# Patient Record
Sex: Female | Born: 1955 | Hispanic: No | Smoking: Never smoker
Health system: Southern US, Community
[De-identification: ages and names within clinical notes are randomized; demographics above are authoritative.]

## PROBLEM LIST (undated history)

## (undated) DIAGNOSIS — G5632 Lesion of radial nerve, left upper limb: Secondary | ICD-10-CM

## (undated) DIAGNOSIS — I10 Essential (primary) hypertension: Secondary | ICD-10-CM

## (undated) HISTORY — PX: TUBAL LIGATION: SHX77

## (undated) HISTORY — PX: ABDOMINAL HYSTERECTOMY: SHX81

## (undated) HISTORY — PX: BREAST BIOPSY: SHX20

---

## 2017-10-02 ENCOUNTER — Other Ambulatory Visit: Payer: Self-pay | Admitting: Orthopedic Surgery

## 2017-10-04 ENCOUNTER — Encounter (HOSPITAL_BASED_OUTPATIENT_CLINIC_OR_DEPARTMENT_OTHER): Payer: Self-pay | Admitting: *Deleted

## 2017-10-04 ENCOUNTER — Other Ambulatory Visit: Payer: Self-pay

## 2017-10-06 ENCOUNTER — Encounter (HOSPITAL_BASED_OUTPATIENT_CLINIC_OR_DEPARTMENT_OTHER)
Admission: RE | Admit: 2017-10-06 | Discharge: 2017-10-06 | Disposition: A | Discharge status: Home / Self Care | Payer: 59 | Source: Ambulatory Visit | Attending: Orthopedic Surgery | Admitting: Orthopedic Surgery

## 2017-10-06 DIAGNOSIS — I10 Essential (primary) hypertension: Secondary | ICD-10-CM | POA: Diagnosis not present

## 2017-10-06 DIAGNOSIS — Z79899 Other long term (current) drug therapy: Secondary | ICD-10-CM | POA: Diagnosis not present

## 2017-10-06 DIAGNOSIS — G5632 Lesion of radial nerve, left upper limb: Secondary | ICD-10-CM | POA: Diagnosis not present

## 2017-10-06 LAB — BASIC METABOLIC PANEL
ANION GAP: 10 (ref 5–15)
BUN: 14 mg/dL (ref 6–20)
CALCIUM: 9.5 mg/dL (ref 8.9–10.3)
CHLORIDE: 104 mmol/L (ref 101–111)
CO2: 25 mmol/L (ref 22–32)
Creatinine, Ser: 0.88 mg/dL (ref 0.44–1.00)
GFR calc non Af Amer: >60 mL/min (ref >60)
Glucose, Bld: 64 mg/dL — ABNORMAL LOW (ref 65–99)
POTASSIUM: 4.2 mmol/L (ref 3.5–5.1)
Sodium: 139 mmol/L (ref 135–145)

## 2017-10-08 NOTE — H&P (Signed)
Sheila Walters is an 62 y.o. female.   CC / Reason for Visit: Left arm problem HPI: This patient presents for reevaluation, having undergone interval MRI scan on 09-28-17, revealing abnormal edema in the PIN innervated muscles.  There is no obvious extrinsic compression of the PIN.  She remains unchanged.  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  She reports that her problem began originally on the 17th, when she began to have a little bit of mild discomfort in her left forearm at work.  By the evening, she had taken more Aleve due to increased soreness.  She actually began to experience excruciating pain over the first few days, and subsequently developed left upper extremity neurological symptoms.  She was evaluated by Dr. Althea CharonMcKinley on 08-02-17, at which time she was noted to have essentially no active wrist extension or digital extension.  Ultrasound-guided steroid injection was performed on 08-24-17.  She now has a dynamic extension splint for the digits, is working with hand therapy, and underwent NCS/EMG onto-21-19 with Dr. Murray HodgkinsBartko.  The studies reveal normal EMG of the triceps, BR, and ECRL, with no motor units seen in the EDC, ECU, and DIP.  In addition radial sensory response was normal and symmetric.  She has been out of work as she apparently cannot engage in light duty work.  She reports that the pain has resolved, leaving her mainly with weakness, no specific numbness.  Past Medical History:  Diagnosis Date  . Hypertension   . Radial neuropathy, left     Past Surgical History:  Procedure Laterality Date  . ABDOMINAL HYSTERECTOMY    . BREAST BIOPSY Right   . TUBAL LIGATION      History reviewed. No pertinent family history. Social History:  reports that she has never smoked. She has never used smokeless tobacco. She reports that she does not drink alcohol or use drugs.  Allergies: No Known  Allergies  No medications prior to admission.    No results found for this or any previous visit (from the past 48 hour(s)). No results found.  Review of Systems  All other systems reviewed and are negative.   Height 5\' 6"  (1.676 m), weight 71.2 kg (157 lb). Physical Exam  Constitutional:  WD, WN, NAD HEENT:  NCAT, EOMI Neuro/Psych:  Alert & oriented to person, place, and time; appropriate mood & affect Lymphatic: No generalized UE edema or lymphadenopathy Extremities / MSK:  Both UE are normal with respect to appearance, ranges of motion, joint stability, muscle strength/tone, sensation, & perfusion except as otherwise noted:  Intact light touch sensibility throughout the left upper extremity.  There is minimal tenderness in the radial tunnel, not so much either along the radial nerve in the spiral groove or distally.  She has good strength throughout with the exception of the distally radial innervated muscles, where wrist extension is radially deviated, there is no active extension of the digits at the MP joint, nor the thumb.  Labs / Xrays:  No radiographic studies obtained today.  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  Concern exists for [place] mononeuropathy versus possible compressive neuropathy  Plan:  I discussed these findings with her.  I discussed that the clinical question at hand is whether or not she would benefit from surgical exploration and decompression.  The information that we have, is clearly incomplete and uncertain, but provides for 2 competing courses of action.  She  will discuss her situation with her husband and call back to [facility] to convey whether she would like to proceed with surgical decompression of the PIN nerve or continue observation.  I did discuss with her the details of the neuroplasty sufficient for her to provide informed consent.  If she instead wishes not to proceed surgically, we will plan reevaluation  in a month. Jodi Marble, MD 10/08/2017, 9:40 PM

## 2017-10-09 ENCOUNTER — Ambulatory Visit (HOSPITAL_BASED_OUTPATIENT_CLINIC_OR_DEPARTMENT_OTHER): Payer: 59 | Admitting: Certified Registered"

## 2017-10-09 ENCOUNTER — Ambulatory Visit (HOSPITAL_BASED_OUTPATIENT_CLINIC_OR_DEPARTMENT_OTHER)
Admission: RE | Admit: 2017-10-09 | Discharge: 2017-10-09 | Disposition: A | Discharge status: Home / Self Care | Payer: 59 | Source: Ambulatory Visit | Attending: Orthopedic Surgery | Admitting: Orthopedic Surgery

## 2017-10-09 ENCOUNTER — Encounter (HOSPITAL_BASED_OUTPATIENT_CLINIC_OR_DEPARTMENT_OTHER)
Admission: RE | Disposition: A | Discharge status: Home / Self Care | Payer: Self-pay | Source: Ambulatory Visit | Attending: Orthopedic Surgery

## 2017-10-09 ENCOUNTER — Encounter (HOSPITAL_BASED_OUTPATIENT_CLINIC_OR_DEPARTMENT_OTHER): Payer: Self-pay | Admitting: Certified Registered"

## 2017-10-09 ENCOUNTER — Other Ambulatory Visit: Payer: Self-pay

## 2017-10-09 DIAGNOSIS — Z79899 Other long term (current) drug therapy: Secondary | ICD-10-CM | POA: Insufficient documentation

## 2017-10-09 DIAGNOSIS — G5632 Lesion of radial nerve, left upper limb: Secondary | ICD-10-CM | POA: Insufficient documentation

## 2017-10-09 DIAGNOSIS — I10 Essential (primary) hypertension: Secondary | ICD-10-CM | POA: Insufficient documentation

## 2017-10-09 HISTORY — PX: ULNAR NERVE TRANSPOSITION: SHX2595

## 2017-10-09 HISTORY — DX: Lesion of radial nerve, left upper limb: G56.32

## 2017-10-09 HISTORY — DX: Essential (primary) hypertension: I10

## 2017-10-09 SURGERY — ULNAR NERVE DECOMPRESSION/TRANSPOSITION
Anesthesia: General | Site: Elbow | Laterality: Left

## 2017-10-09 MED ORDER — LIDOCAINE HCL (PF) 1 % IJ SOLN
INTRAMUSCULAR | Status: AC
  Filled 2017-10-09: qty 60

## 2017-10-09 MED ORDER — BUPIVACAINE-EPINEPHRINE (PF) 0.5% -1:200000 IJ SOLN
INTRAMUSCULAR | Status: AC
Start: 2017-10-09 — End: ?
  Filled 2017-10-09: qty 120

## 2017-10-09 MED ORDER — OXYCODONE HCL 5 MG PO TABS: 5.0000 mg | ORAL_TABLET | Freq: Four times a day (QID) | ORAL | 0 refills | Status: DC | PRN

## 2017-10-09 MED ORDER — PROPOFOL 10 MG/ML IV BOLUS
INTRAVENOUS | Status: DC | PRN
  Administered 2017-10-09: 150 mg via INTRAVENOUS

## 2017-10-09 MED ORDER — DEXAMETHASONE SODIUM PHOSPHATE 10 MG/ML IJ SOLN
INTRAMUSCULAR | Status: DC | PRN
  Administered 2017-10-09: 10 mg via INTRAVENOUS

## 2017-10-09 MED ORDER — LIDOCAINE HCL (CARDIAC) 20 MG/ML IV SOLN
INTRAVENOUS | Status: DC | PRN
  Administered 2017-10-09: 60 mg via INTRAVENOUS

## 2017-10-09 MED ORDER — FENTANYL CITRATE (PF) 100 MCG/2ML IJ SOLN
INTRAMUSCULAR | Status: AC
  Filled 2017-10-09: qty 2

## 2017-10-09 MED ORDER — CEFAZOLIN SODIUM-DEXTROSE 2-4 GM/100ML-% IV SOLN
INTRAVENOUS | Status: AC
  Filled 2017-10-09: qty 100

## 2017-10-09 MED ORDER — BUPIVACAINE-EPINEPHRINE 0.5% -1:200000 IJ SOLN
INTRAMUSCULAR | Status: DC | PRN
  Administered 2017-10-09: 10 mL

## 2017-10-09 MED ORDER — IBUPROFEN 200 MG PO TABS: 600.0000 mg | ORAL_TABLET | Freq: Four times a day (QID) | ORAL | 0 refills | Status: AC

## 2017-10-09 MED ORDER — LACTATED RINGERS IV SOLN: INTRAVENOUS | Status: DC

## 2017-10-09 MED ORDER — FENTANYL CITRATE (PF) 100 MCG/2ML IJ SOLN
50.0000 ug | INTRAMUSCULAR | Status: DC | PRN
  Administered 2017-10-09 (×2): 50 ug via INTRAVENOUS

## 2017-10-09 MED ORDER — ONDANSETRON HCL 4 MG/2ML IJ SOLN
INTRAMUSCULAR | Status: DC | PRN
  Administered 2017-10-09: 4 mg via INTRAVENOUS

## 2017-10-09 MED ORDER — ACETAMINOPHEN 325 MG PO TABS: 650.0000 mg | ORAL_TABLET | Freq: Four times a day (QID) | ORAL | Status: AC

## 2017-10-09 MED ORDER — MIDAZOLAM HCL 2 MG/2ML IJ SOLN
1.0000 mg | INTRAMUSCULAR | Status: DC | PRN
  Administered 2017-10-09: 2 mg via INTRAVENOUS

## 2017-10-09 MED ORDER — MIDAZOLAM HCL 2 MG/2ML IJ SOLN
INTRAMUSCULAR | Status: AC
  Filled 2017-10-09: qty 2

## 2017-10-09 MED ORDER — METOCLOPRAMIDE HCL 5 MG/ML IJ SOLN: 10.0000 mg | Freq: Once | INTRAMUSCULAR | Status: DC | PRN

## 2017-10-09 MED ORDER — EPHEDRINE SULFATE 50 MG/ML IJ SOLN
INTRAMUSCULAR | Status: DC | PRN
  Administered 2017-10-09: 10 mg via INTRAVENOUS

## 2017-10-09 MED ORDER — SCOPOLAMINE 1 MG/3DAYS TD PT72: 1.0000 | MEDICATED_PATCH | Freq: Once | TRANSDERMAL | Status: DC | PRN

## 2017-10-09 MED ORDER — CEFAZOLIN SODIUM-DEXTROSE 2-4 GM/100ML-% IV SOLN
2.0000 g | INTRAVENOUS | Status: AC
  Administered 2017-10-09: 2 g via INTRAVENOUS

## 2017-10-09 MED ORDER — LACTATED RINGERS IV SOLN
INTRAVENOUS | Status: DC
  Administered 2017-10-09: 08:00:00 via INTRAVENOUS

## 2017-10-09 MED ORDER — FENTANYL CITRATE (PF) 100 MCG/2ML IJ SOLN: 25.0000 ug | INTRAMUSCULAR | Status: DC | PRN

## 2017-10-09 MED ORDER — MEPERIDINE HCL 25 MG/ML IJ SOLN: 6.2500 mg | INTRAMUSCULAR | Status: DC | PRN

## 2017-10-09 SURGICAL SUPPLY — 59 items
BENZOIN TINCTURE PRP APPL 2/3 (GAUZE/BANDAGES/DRESSINGS) ×2 IMPLANT
BLADE MINI RND TIP GREEN BEAV (BLADE) IMPLANT
BLADE SURG 15 STRL LF DISP TIS (BLADE) ×1 IMPLANT
BLADE SURG 15 STRL SS (BLADE) ×1
BNDG COHESIVE 4X5 TAN STRL (GAUZE/BANDAGES/DRESSINGS) ×2 IMPLANT
BNDG ESMARK 4X9 LF (GAUZE/BANDAGES/DRESSINGS) ×2 IMPLANT
BNDG GAUZE ELAST 4 BULKY (GAUZE/BANDAGES/DRESSINGS) ×2 IMPLANT
CHLORAPREP W/TINT 26ML (MISCELLANEOUS) ×2 IMPLANT
CORD BIPOLAR FORCEPS 12FT (ELECTRODE) ×2 IMPLANT
COVER BACK TABLE 60X90IN (DRAPES) ×2 IMPLANT
COVER MAYO STAND STRL (DRAPES) ×2 IMPLANT
CUFF TOURN SGL LL 18 NRW (TOURNIQUET CUFF) IMPLANT
CUFF TOURNIQUET SINGLE 18IN (TOURNIQUET CUFF) ×2 IMPLANT
DRAIN PENROSE 1/2X12 LTX STRL (WOUND CARE) IMPLANT
DRAIN PENROSE 1/4X12 LTX STRL (WOUND CARE) IMPLANT
DRAPE EXTREMITY T 121X128X90 (DRAPE) ×2 IMPLANT
DRAPE SURG 17X23 STRL (DRAPES) ×2 IMPLANT
DRAPE U-SHAPE 47X51 STRL (DRAPES) IMPLANT
DRSG ADAPTIC 3X8 NADH LF (GAUZE/BANDAGES/DRESSINGS) IMPLANT
DRSG EMULSION OIL 3X3 NADH (GAUZE/BANDAGES/DRESSINGS) ×2 IMPLANT
ELECT REM PT RETURN 9FT ADLT (ELECTROSURGICAL) ×2
ELECTRODE REM PT RTRN 9FT ADLT (ELECTROSURGICAL) ×1 IMPLANT
GAUZE SPONGE 4X4 12PLY STRL LF (GAUZE/BANDAGES/DRESSINGS) ×2 IMPLANT
GLOVE BIO SURGEON STRL SZ 6.5 (GLOVE) ×2 IMPLANT
GLOVE BIO SURGEON STRL SZ7.5 (GLOVE) ×2 IMPLANT
GLOVE BIOGEL PI IND STRL 7.0 (GLOVE) ×2 IMPLANT
GLOVE BIOGEL PI IND STRL 8 (GLOVE) ×1 IMPLANT
GLOVE BIOGEL PI INDICATOR 7.0 (GLOVE) ×2
GLOVE BIOGEL PI INDICATOR 8 (GLOVE) ×1
GLOVE ECLIPSE 6.5 STRL STRAW (GLOVE) ×2 IMPLANT
GOWN STRL REUS W/ TWL LRG LVL3 (GOWN DISPOSABLE) ×2 IMPLANT
GOWN STRL REUS W/TWL LRG LVL3 (GOWN DISPOSABLE) ×2
GOWN STRL REUS W/TWL XL LVL3 (GOWN DISPOSABLE) ×2 IMPLANT
LOOP VESSEL MAXI BLUE (MISCELLANEOUS) IMPLANT
NEEDLE HYPO 25X1 1.5 SAFETY (NEEDLE) ×2 IMPLANT
NS IRRIG 1000ML POUR BTL (IV SOLUTION) ×2 IMPLANT
PACK BASIN DAY SURGERY FS (CUSTOM PROCEDURE TRAY) ×2 IMPLANT
PADDING CAST ABS 4INX4YD NS (CAST SUPPLIES) ×1
PADDING CAST ABS COTTON 4X4 ST (CAST SUPPLIES) ×1 IMPLANT
PENCIL BUTTON HOLSTER BLD 10FT (ELECTRODE) ×2 IMPLANT
SLEEVE SCD COMPRESS KNEE MED (MISCELLANEOUS) ×2 IMPLANT
SLING ARM FOAM STRAP LRG (SOFTGOODS) IMPLANT
SLING ARM MED ADULT FOAM STRAP (SOFTGOODS) IMPLANT
SLING ARM XL FOAM STRAP (SOFTGOODS) IMPLANT
STOCKINETTE 6  STRL (DRAPES) ×1
STOCKINETTE 6 STRL (DRAPES) ×1 IMPLANT
STRIP CLOSURE SKIN 1/2X4 (GAUZE/BANDAGES/DRESSINGS) ×2 IMPLANT
SUT ETHILON 8 0 BV130 4 (SUTURE) IMPLANT
SUT VIC AB 0 SH 27 (SUTURE) IMPLANT
SUT VIC AB 2-0 SH 27 (SUTURE)
SUT VIC AB 2-0 SH 27XBRD (SUTURE) IMPLANT
SUT VIC AB 3-0 SH 27 (SUTURE) ×1
SUT VIC AB 3-0 SH 27X BRD (SUTURE) ×1 IMPLANT
SUT VICRYL RAPIDE 4/0 PS 2 (SUTURE) ×2 IMPLANT
SYR 10ML LL (SYRINGE) ×2 IMPLANT
SYR BULB 3OZ (MISCELLANEOUS) ×2 IMPLANT
TOWEL OR 17X24 6PK STRL BLUE (TOWEL DISPOSABLE) ×4 IMPLANT
TOWEL OR NON WOVEN STRL DISP B (DISPOSABLE) IMPLANT
UNDERPAD 30X30 (UNDERPADS AND DIAPERS) IMPLANT

## 2017-10-09 NOTE — Transfer of Care (Signed)
Immediate Anesthesia Transfer of Care Note  Patient: Sheila Walters  Procedure(s) Performed: LEFT RADIAL NEUROPLASTY (Left Elbow)  Patient Location: PACU  Anesthesia Type:General  Level of Consciousness: awake and patient cooperative  Airway & Oxygen Therapy: Patient Spontanous Breathing and Patient connected to face mask oxygen  Post-op Assessment: Report given to RN and Post -op Vital signs reviewed and stable  Post vital signs: Reviewed and stable  Last Vitals:  Vitals Value Taken Time  BP    Temp    Pulse    Resp    SpO2      Last Pain:  Vitals:   10/09/17 0719  TempSrc: Oral  PainSc: 1       Patients Stated Pain Goal: 1 (10/09/17 0719)  Complications: No apparent anesthesia complications

## 2017-10-09 NOTE — Discharge Instructions (Signed)
Discharge Instructions   You have a light dressing on your hand.  You may begin gentle motion of your fingers and hand immediately, but you should not do any heavy lifting or gripping.  Elevate your hand to reduce pain & swelling of the digits.  Ice over the operative site may be helpful to reduce pain & swelling.  DO NOT USE HEAT. Pain medicine has been prescribed for you.  Take Tylenol 650 mg and Ibuprofen 600 mg together every 6 hours. Take Oxycodone 5 mg as needed additionally for severe breakthrough pain as a rescue medicine. Leave the dressing in place until the third day after your surgery and then remove it, leaving it open to air.  After the bandage has been removed you may shower, regularly washing the incision and letting the water run over it, but not submerging it (no swimming, soaking it in dishwater, etc.) You may drive a car when you are off of prescription pain medications and can safely control your vehicle with both hands. We will address whether therapy will be required or not when you return to the office. You may have already made your follow-up appointment when we completed your preop visit.  If not, please call our office today or the next business day to make your return appointment for 10-15 days after surgery.   Please call [phone] during normal business hours or [phone] after hours for any problems. Including the following:  - excessive redness of the incisions - drainage for more than 4 days - fever of more than 101.5 F  *Please note that pain medications will not be refilled after hours or on weekends.  WORK STATUS:  OUT OF WORK until follow post operative follow up appointment.       Post Anesthesia Home Care Instructions  Activity: Get plenty of rest for the remainder of the day. A responsible individual must stay with you for 24 hours following the procedure.  For the next 24 hours, DO NOT: -Drive a car -Advertising copywriterperate machinery -Drink alcoholic  beverages -Take any medication unless instructed by your physician -Make any legal decisions or sign important papers.  Meals: Start with liquid foods such as gelatin or soup. Progress to regular foods as tolerated. Avoid greasy, spicy, heavy foods. If nausea and/or vomiting occur, drink only clear liquids until the nausea and/or vomiting subsides. Call your physician if vomiting continues.  Special Instructions/Symptoms: Your throat may feel dry or sore from the anesthesia or the breathing tube placed in your throat during surgery. If this causes discomfort, gargle with warm salt water. The discomfort should disappear within 24 hours.  If you had a scopolamine patch placed behind your ear for the management of post- operative nausea and/or vomiting:  1. The medication in the patch is effective for 72 hours, after which it should be removed.  Wrap patch in a tissue and discard in the trash. Wash hands thoroughly with soap and water. 2. You may remove the patch earlier than 72 hours if you experience unpleasant side effects which may include dry mouth, dizziness or visual disturbances. 3. Avoid touching the patch. Wash your hands with soap and water after contact with the patch.

## 2017-10-09 NOTE — Anesthesia Preprocedure Evaluation (Signed)
Anesthesia Evaluation  Patient identified by MRN, date of birth, ID band Patient awake    Reviewed: Allergy & Precautions, NPO status , Patient's Chart, lab work & pertinent test results  Airway Mallampati: II  TM Distance: >3 FB Neck ROM: Full    Dental no notable dental hx. (+) Missing, Partial Upper   Pulmonary neg pulmonary ROS,    Pulmonary exam normal breath sounds clear to auscultation       Cardiovascular hypertension, Pt. on medications Normal cardiovascular exam Rhythm:Regular Rate:Normal     Neuro/Psych negative neurological ROS  negative psych ROS   GI/Hepatic negative GI ROS, Neg liver ROS,   Endo/Other  negative endocrine ROS  Renal/GU negative Renal ROS  negative genitourinary   Musculoskeletal negative musculoskeletal ROS (+)   Abdominal   Peds negative pediatric ROS (+)  Hematology negative hematology ROS (+)   Anesthesia Other Findings   Reproductive/Obstetrics negative OB ROS                             Anesthesia Physical Anesthesia Plan  ASA: II  Anesthesia Plan: General   Post-op Pain Management:    Induction: Intravenous  PONV Risk Score and Plan: 3 and Ondansetron, Dexamethasone, Midazolam and Treatment may vary due to age or medical condition  Airway Management Planned: LMA  Additional Equipment:   Intra-op Plan:   Post-operative Plan: Extubation in OR  Informed Consent: I have reviewed the patients History and Physical, chart, labs and discussed the procedure including the risks, benefits and alternatives for the proposed anesthesia with the patient or authorized representative who has indicated his/her understanding and acceptance.   Dental advisory given  Plan Discussed with: CRNA  Anesthesia Plan Comments:         Anesthesia Quick Evaluation

## 2017-10-09 NOTE — Anesthesia Postprocedure Evaluation (Signed)
Anesthesia Post Note  Patient: Sheila Walters  Procedure(s) Performed: LEFT RADIAL NEUROPLASTY (Left Elbow)     Patient location during evaluation: PACU Anesthesia Type: General Level of consciousness: awake and alert Pain management: pain level controlled Vital Signs Assessment: post-procedure vital signs reviewed and stable Respiratory status: spontaneous breathing, nonlabored ventilation, respiratory function stable and patient connected to nasal cannula oxygen Cardiovascular status: blood pressure returned to baseline and stable Postop Assessment: no apparent nausea or vomiting Anesthetic complications: no    Last Vitals:  Vitals:   10/09/17 1000 10/09/17 1022  BP: (!) 164/88 (!) 162/88  Pulse: 85 92  Resp: 19 18  Temp:  36.5 C  SpO2: 100% 100%    Last Pain:  Vitals:   10/09/17 1022  TempSrc:   PainSc: 0-No pain                 Phillips Groutarignan, Dayanira Giovannetti

## 2017-10-09 NOTE — Anesthesia Procedure Notes (Signed)
Procedure Name: LMA Insertion Date/Time: 10/09/2017 8:33 AM Performed by: Sheryn BisonBlocker, Carter Kassel D, CRNA Pre-anesthesia Checklist: Patient identified, Emergency Drugs available, Suction available and Patient being monitored Patient Re-evaluated:Patient Re-evaluated prior to induction Oxygen Delivery Method: Circle system utilized Preoxygenation: Pre-oxygenation with 100% oxygen Induction Type: IV induction Ventilation: Mask ventilation without difficulty LMA: LMA inserted LMA Size: 4.0 Number of attempts: 1 Airway Equipment and Method: Bite block Placement Confirmation: positive ETCO2 Tube secured with: Tape Dental Injury: Teeth and Oropharynx as per pre-operative assessment

## 2017-10-09 NOTE — Op Note (Signed)
10/09/2017  8:26 AM  PATIENT:  Sheila Walters  62 y.o. female  PRE-OPERATIVE DIAGNOSIS: Left PIN neuropathy  POST-OPERATIVE DIAGNOSIS:  Same  PROCEDURE: Left radial neuroplasty in the proximal forearm  SURGEON: Cliffton Astersavid A. Janee Mornhompson, MD  PHYSICIAN ASSISTANT: Danielle RankinKirsten Schrader, OPA-C  ANESTHESIA:  general  SPECIMENS:  None  DRAINS:   None  EBL:  less than 50 mL  PREOPERATIVE INDICATIONS:  Sheila Walters is a  62 y.o. female with left PIN neuropathy of unclear etiology  The risks benefits and alternatives were discussed with the patient preoperatively including but not limited to the risks of infection, bleeding, nerve injury, cardiopulmonary complications, the need for revision surgery, among others, and the patient verbalized understanding and consented to proceed.  OPERATIVE IMPLANTS: None  OPERATIVE PROCEDURE:  After receiving prophylactic antibiotics, the patient was escorted to the operative theatre and placed in a supine position.  General anesthesia was administered.  A surgical "time-out" was performed during which the planned procedure, proposed operative site, and the correct patient identity were compared to the operative consent and agreement confirmed by the circulating nurse according to current facility policy.  Following application of a tourniquet to the operative extremity, the exposed skin was prepped with Chloraprep and draped in the usual sterile fashion.  The limb was exsanguinated with an Esmarch bandage and the tourniquet inflated to approximately 100mmHg higher than systolic BP.  Half percent Marcaine with epinephrine was instilled in and around the planned skin incision.  A longitudinal slightly oblique incision was made in the proximal forearm, overlying the course of the radial nerve.  The skin was incised sharply with a scalpel, subcutaneous tissues dissected with blunt spreading dissection raising full-thickness flaps.  The interval between  the ECRL and BR was identified and exploited.  Digital dissection allowed exploitation of this interval down to the radial nerve.  Self-retaining retractors were placed.  The superficial radial nerve was identified and it was dissected free throughout the course of the incision, freeing it from encapsulating soft tissue and any crossing bands.  The PIN was identified and was slightly narrowed at the leading edge of the supinator, a little bulbous proximal to this.  The supinator was split, and the radial nerve dissected free of encapsulating and constricting soft tissues.  Next, in order to decompress it at the distal edge of the supinator, a different interval was exploited which was ulnar to the ECRL and B.  This allowed me to identify the nerve as it emerged from the supinator and to split the distal fascial bands of the supinator.  The nerve was thus fully decompressed throughout its course.  Tourniquet was released and the wound was irrigated.  Some additional hemostasis was obtained with bipolar electrocautery and the skin was closed with 3-0 Vicryl deep dermal buried subcuticular sutures and a running 4-0 Vicryl Rapide subcuticular suture with benzoin and Steri-Strips.  A bulky dressing was applied and she was taken to the recovery room in stable condition.  DISPOSITION: She will be discharged home today with typical instructions, returning in 10-15 days.  Out of work until she returns.

## 2017-10-09 NOTE — Interval H&P Note (Signed)
History and Physical Interval Note:  10/09/2017 8:25 AM  Sheila Walters  has presented today for surgery, with the diagnosis of LEFT RADIAL NEUROPATHY G56.32  The various methods of treatment have been discussed with the patient and family. After consideration of risks, benefits and other options for treatment, the patient has consented to  Procedure(s): LEFT RADIAL NEUROPLASTY (Left) as a surgical intervention .  The patient's history has been reviewed, patient examined, no change in status, stable for surgery.  I have reviewed the patient's chart and labs.  Questions were answered to the patient's satisfaction.     Jodi Marbleavid A Manus Weedman

## 2017-10-10 ENCOUNTER — Encounter (HOSPITAL_BASED_OUTPATIENT_CLINIC_OR_DEPARTMENT_OTHER): Payer: Self-pay | Admitting: Orthopedic Surgery

## 2018-06-09 ENCOUNTER — Observation Stay (HOSPITAL_COMMUNITY)
Admission: EM | Admit: 2018-06-09 | Discharge: 2018-06-11 | Disposition: A | Discharge status: Home / Self Care | Payer: 59 | Attending: Family Medicine | Admitting: Family Medicine

## 2018-06-09 ENCOUNTER — Encounter (HOSPITAL_COMMUNITY): Payer: Self-pay

## 2018-06-09 ENCOUNTER — Other Ambulatory Visit: Payer: Self-pay

## 2018-06-09 ENCOUNTER — Emergency Department (HOSPITAL_COMMUNITY): Payer: 59

## 2018-06-09 DIAGNOSIS — E7800 Pure hypercholesterolemia, unspecified: Secondary | ICD-10-CM | POA: Diagnosis not present

## 2018-06-09 DIAGNOSIS — R7989 Other specified abnormal findings of blood chemistry: Secondary | ICD-10-CM

## 2018-06-09 DIAGNOSIS — I10 Essential (primary) hypertension: Secondary | ICD-10-CM | POA: Diagnosis not present

## 2018-06-09 DIAGNOSIS — R740 Nonspecific elevation of levels of transaminase and lactic acid dehydrogenase [LDH]: Secondary | ICD-10-CM | POA: Diagnosis not present

## 2018-06-09 DIAGNOSIS — G629 Polyneuropathy, unspecified: Secondary | ICD-10-CM | POA: Insufficient documentation

## 2018-06-09 DIAGNOSIS — E876 Hypokalemia: Secondary | ICD-10-CM | POA: Insufficient documentation

## 2018-06-09 DIAGNOSIS — Z791 Long term (current) use of non-steroidal anti-inflammatories (NSAID): Secondary | ICD-10-CM | POA: Insufficient documentation

## 2018-06-09 DIAGNOSIS — E785 Hyperlipidemia, unspecified: Secondary | ICD-10-CM | POA: Insufficient documentation

## 2018-06-09 DIAGNOSIS — R270 Ataxia, unspecified: Secondary | ICD-10-CM | POA: Diagnosis not present

## 2018-06-09 DIAGNOSIS — R945 Abnormal results of liver function studies: Secondary | ICD-10-CM

## 2018-06-09 DIAGNOSIS — Z79899 Other long term (current) drug therapy: Secondary | ICD-10-CM | POA: Diagnosis not present

## 2018-06-09 DIAGNOSIS — R112 Nausea with vomiting, unspecified: Secondary | ICD-10-CM | POA: Insufficient documentation

## 2018-06-09 DIAGNOSIS — G459 Transient cerebral ischemic attack, unspecified: Principal | ICD-10-CM | POA: Insufficient documentation

## 2018-06-09 DIAGNOSIS — R197 Diarrhea, unspecified: Secondary | ICD-10-CM | POA: Diagnosis not present

## 2018-06-09 LAB — CBC
HCT: 42.9 % (ref 36.0–46.0)
Hemoglobin: 13.7 g/dL (ref 12.0–15.0)
MCH: 28.4 pg (ref 26.0–34.0)
MCHC: 31.9 g/dL (ref 30.0–36.0)
MCV: 89 fL (ref 80.0–100.0)
Platelets: 217 10*3/uL (ref 150–400)
RBC: 4.82 MIL/uL (ref 3.87–5.11)
RDW: 14.4 % (ref 11.5–15.5)
WBC: 6.1 10*3/uL (ref 4.0–10.5)
nRBC: 0 % (ref 0.0–0.2)

## 2018-06-09 LAB — DIFFERENTIAL
ABS IMMATURE GRANULOCYTES: 0.02 10*3/uL (ref 0.00–0.07)
Basophils Absolute: 0 10*3/uL (ref 0.0–0.1)
Basophils Relative: 0 %
Eosinophils Absolute: 0.2 10*3/uL (ref 0.0–0.5)
Eosinophils Relative: 3 %
Immature Granulocytes: 0 %
Lymphocytes Relative: 41 %
Lymphs Abs: 2.5 10*3/uL (ref 0.7–4.0)
Monocytes Absolute: 0.4 10*3/uL (ref 0.1–1.0)
Monocytes Relative: 6 %
Neutro Abs: 3.1 10*3/uL (ref 1.7–7.7)
Neutrophils Relative %: 50 %
WBC Morphology: ABNORMAL

## 2018-06-09 LAB — COMPREHENSIVE METABOLIC PANEL
ALBUMIN: 3.4 g/dL — AB (ref 3.5–5.0)
ALT: 194 U/L — AB (ref 0–44)
AST: 158 U/L — AB (ref 15–41)
Alkaline Phosphatase: 88 U/L (ref 38–126)
Anion gap: 9 (ref 5–15)
BUN: 9 mg/dL (ref 8–23)
CO2: 24 mmol/L (ref 22–32)
Calcium: 8.6 mg/dL — ABNORMAL LOW (ref 8.9–10.3)
Chloride: 110 mmol/L (ref 98–111)
Creatinine, Ser: 0.81 mg/dL (ref 0.44–1.00)
GFR calc Af Amer: >60 mL/min (ref >60)
GFR calc non Af Amer: >60 mL/min (ref >60)
GLUCOSE: 103 mg/dL — AB (ref 70–99)
Potassium: 3.4 mmol/L — ABNORMAL LOW (ref 3.5–5.1)
Sodium: 143 mmol/L (ref 135–145)
Total Bilirubin: 1.1 mg/dL (ref 0.3–1.2)
Total Protein: 7.2 g/dL (ref 6.5–8.1)

## 2018-06-09 LAB — I-STAT CHEM 8, ED
BUN: 6 mg/dL — ABNORMAL LOW (ref 8–23)
CALCIUM ION: 1.14 mmol/L — AB (ref 1.15–1.40)
Chloride: 110 mmol/L (ref 98–111)
Creatinine, Ser: 0.8 mg/dL (ref 0.44–1.00)
Glucose, Bld: 101 mg/dL — ABNORMAL HIGH (ref 70–99)
HCT: 42 % (ref 36.0–46.0)
Hemoglobin: 14.3 g/dL (ref 12.0–15.0)
Potassium: 3.4 mmol/L — ABNORMAL LOW (ref 3.5–5.1)
Sodium: 144 mmol/L (ref 135–145)
TCO2: 25 mmol/L (ref 22–32)

## 2018-06-09 LAB — CBG MONITORING, ED: Glucose-Capillary: 85 mg/dL (ref 70–99)

## 2018-06-09 LAB — I-STAT TROPONIN, ED: Troponin i, poc: 0 ng/mL (ref 0.00–0.08)

## 2018-06-09 LAB — PROTIME-INR
INR: 1.01
Prothrombin Time: 13.3 seconds (ref 11.4–15.2)

## 2018-06-09 LAB — ETHANOL: Alcohol, Ethyl (B): <10 mg/dL (ref <10)

## 2018-06-09 LAB — APTT: aPTT: 27 seconds (ref 24–36)

## 2018-06-09 MED ORDER — IOPAMIDOL (ISOVUE-370) INJECTION 76%
INTRAVENOUS | Status: AC
  Filled 2018-06-09: qty 100

## 2018-06-09 MED ORDER — ADULT MULTIVITAMIN W/MINERALS CH
1.0000 | ORAL_TABLET | Freq: Every day | ORAL | Status: DC
  Administered 2018-06-09 – 2018-06-11 (×3): 1 via ORAL
  Filled 2018-06-09 (×3): qty 1

## 2018-06-09 MED ORDER — POTASSIUM CHLORIDE 10 MEQ/100ML IV SOLN
10.0000 meq | Freq: Once | INTRAVENOUS | Status: AC
  Administered 2018-06-09: 10 meq via INTRAVENOUS
  Filled 2018-06-09: qty 100

## 2018-06-09 MED ORDER — IOPAMIDOL (ISOVUE-370) INJECTION 76%
100.0000 mL | Freq: Once | INTRAVENOUS | Status: AC | PRN
  Administered 2018-06-09: 100 mL via INTRAVENOUS

## 2018-06-09 MED ORDER — HYDROCHLOROTHIAZIDE 25 MG PO TABS
25.0000 mg | ORAL_TABLET | Freq: Every day | ORAL | Status: DC
  Administered 2018-06-09 – 2018-06-10 (×2): 25 mg via ORAL
  Filled 2018-06-09 (×2): qty 1

## 2018-06-09 MED ORDER — SODIUM CHLORIDE (PF) 0.9 % IJ SOLN
INTRAMUSCULAR | Status: AC
  Filled 2018-06-09: qty 50

## 2018-06-09 NOTE — ED Provider Notes (Signed)
[facility]-EMERGENCY DEPT Provider Note   CSN: [id number] Arrival date & time: 06/09/18  1112     History   Chief Complaint No chief complaint on file.   HPI Sheila Walters is a 62 y.o. female.  HPI Sheila Walters is a 62 y.o. female presents to emergency department with complaint of right-sided weakness, slurred speech, generalized weakness.  Patient has had nausea, vomiting, diarrhea 2 days ago.  Reports episode of blurred vision at that time as well which resolved.  Today around 9:45 AM, she states she was about to put make-up on, said she was going to a primary care doctor's appointment, when she was unable to use her right hand.  She states that she had trouble putting on make-up and trouble typing.  She states that she called her sister, who states that she was slurring her speech at that time.  Her sister came and brought her here.  Patient also reports some difficulty walking, states that her right leg feels weak, but also states both of her legs feel weak and she is unable to walk up straight.  Her sister states "she is walking like a Chimpanzee is."   Past Medical History:  Diagnosis Date  . Hypertension   . Radial neuropathy, left     There are no active problems to display for this patient.   Past Surgical History:  Procedure Laterality Date  . ABDOMINAL HYSTERECTOMY    . BREAST BIOPSY Right   . TUBAL LIGATION    . ULNAR NERVE TRANSPOSITION Left 10/09/2017   Procedure: LEFT RADIAL NEUROPLASTY;  Surgeon: Mack Hookhompson, David, MD;  Location: [facility];  Service: Orthopedics;  Laterality: Left;     OB History   None      Home Medications    Prior to Admission medications   Medication Sig Start Date End Date Taking? Authorizing Provider  acetaminophen (TYLENOL) 325 MG tablet Take 2 tablets (650 mg total) by mouth every 6 (six) hours. 10/09/17   Mack Hookhompson, David, MD  hydrochlorothiazide (HYDRODIURIL) 25 MG tablet  Take 25 mg by mouth daily.    [provider]  ibuprofen (ADVIL) 200 MG tablet Take 3 tablets (600 mg total) by mouth every 6 (six) hours. 10/09/17   Mack Hookhompson, David, MD  oxyCODONE (ROXICODONE) 5 MG immediate release tablet Take 1 tablet (5 mg total) by mouth every 6 (six) hours as needed for breakthrough pain. 10/09/17   Mack Hookhompson, David, MD    Family History No family history on file.  Social History Social History   Tobacco Use  . Smoking status: Never Smoker  . Smokeless tobacco: Never Used  Substance Use Topics  . Alcohol use: Never    Frequency: Never  . Drug use: Never     Allergies   Patient has no known allergies.   Review of Systems Review of Systems  Constitutional: Negative for chills and fever.  Respiratory: Negative for cough, chest tightness and shortness of breath.   Cardiovascular: Negative for chest pain, palpitations and leg swelling.  Gastrointestinal: Positive for diarrhea, nausea and vomiting. Negative for abdominal pain.  Genitourinary: Negative for dysuria, flank pain and pelvic pain.  Musculoskeletal: Negative for arthralgias, myalgias, neck pain and neck stiffness.  Skin: Negative for rash.  Neurological: Positive for speech difficulty, weakness and numbness. Negative for dizziness and headaches.  All other systems reviewed and are negative.    Physical Exam Updated Vital Signs There were no vitals taken for this visit.  Physical Exam  Constitutional: She appears well-developed and well-nourished. No distress.  HENT:  Head: Normocephalic.  Eyes: Conjunctivae are normal.  Neck: Neck supple.  Cardiovascular: Normal rate, regular rhythm and normal heart sounds.  Pulmonary/Chest: Effort normal and breath sounds normal. No respiratory distress. She has no wheezes. She has no rales.  Abdominal: Soft. Bowel sounds are normal. She exhibits no distension. There is no tenderness. There is no rebound.  Musculoskeletal: She exhibits no edema.    Neurological: She is alert.  Cranial nerves intact.  Right-sided pronator drift present.  Right leg weakness with hip flexion.  Normal foot dorsiflexion plantarflexion bilaterally.  Normal bicep and tricep strength bilaterally.  Grip strength are intact.  Sensation is intact in upper and lower extremities.  Skin: Skin is warm and dry.  Psychiatric: She has a normal mood and affect. Her behavior is normal.  Nursing note and vitals reviewed.    ED Treatments / Results  Labs (all labs ordered are listed, but only abnormal results are displayed) Labs Reviewed  COMPREHENSIVE METABOLIC PANEL - Abnormal; Notable for the following components:      Result Value   Potassium 3.4 (*)    Glucose, Bld 103 (*)    Calcium 8.6 (*)    Albumin 3.4 (*)    AST 158 (*)    ALT 194 (*)    All other components within normal limits  I-STAT CHEM 8, ED - Abnormal; Notable for the following components:   Potassium 3.4 (*)    BUN 6 (*)    Glucose, Bld 101 (*)    Calcium, Ion 1.14 (*)    All other components within normal limits  ETHANOL  PROTIME-INR  APTT  CBC  DIFFERENTIAL  RAPID URINE DRUG SCREEN, HOSP PERFORMED  URINALYSIS, ROUTINE W REFLEX MICROSCOPIC  CBG MONITORING, ED  I-STAT TROPONIN, ED    EKG None  Radiology Ct Angio Head W Or Wo Contrast  Result Date: 06/09/2018 CLINICAL DATA:  Right-sided weakness. EXAM: CT ANGIOGRAPHY HEAD AND NECK TECHNIQUE: Multidetector CT imaging of the head and neck was performed using the standard protocol during bolus administration of intravenous contrast. Multiplanar CT image reconstructions and MIPs were obtained to evaluate the vascular anatomy. Carotid stenosis measurements (when applicable) are obtained utilizing NASCET criteria, using the distal internal carotid diameter as the denominator. CONTRAST:  ISOVUE-370 IOPAMIDOL (ISOVUE-370) INJECTION 76% COMPARISON:  None. FINDINGS: CTA NECK FINDINGS Aortic arch: Normal variant aortic arch branching  pattern with common origin of the brachiocephalic and left common carotid arteries. Mild arch atherosclerosis. Widely patent arch vessel origins. Right carotid system: Patent with mild soft plaque at the carotid bifurcation not resulting in significant stenosis. Left carotid system: Patent with mild soft plaque at the carotid bifurcation not resulting in significant stenosis. Retropharyngeal course of the proximal ICA. Vertebral arteries: Patent without evidence of stenosis or dissection. Slightly dominant right vertebral artery. Skeleton: Cervical spondylosis with bulky anterior vertebral spurring at C4-5. Other neck: Mild diffuse thyroid heterogeneity with the largest discrete nodule measuring 1.2 cm anteriorly in the left lobe. Upper chest: Clear lung apices. Review of the MIP images confirms the above findings CTA HEAD FINDINGS Anterior circulation: The internal carotid arteries are patent from skull base to carotid termini with mild atherosclerotic irregularity but no significant stenosis. ACAs and MCAs are patent without evidence of proximal branch occlusion or significant proximal stenosis. No aneurysm is identified. Posterior circulation: The intracranial vertebral arteries are widely patent to the basilar. Patent PICA and SCA origins are identified bilaterally.  The basilar artery is widely patent. Posterior communicating arteries are diminutive or absent. PCAs are patent without evidence of significant stenosis. No aneurysm is identified. Venous sinuses: Patent. Anatomic variants: None. Delayed phase: No abnormal enhancement. Review of the MIP images confirms the above findings IMPRESSION: Mild intracranial and cervical atherosclerosis without large vessel occlusion or significant stenosis. Aortic Atherosclerosis (ICD10-I70.0). These results were called by telephone at the time of interpretation on 06/09/2018 at 12:26 pm to Dr. Lorre Nick, who verbally acknowledged these results. Electronically Signed    By: Sebastian Ache M.D.   On: 06/09/2018 12:34   Ct Angio Neck W And/or Wo Contrast  Result Date: 06/09/2018 CLINICAL DATA:  Right-sided weakness. EXAM: CT ANGIOGRAPHY HEAD AND NECK TECHNIQUE: Multidetector CT imaging of the head and neck was performed using the standard protocol during bolus administration of intravenous contrast. Multiplanar CT image reconstructions and MIPs were obtained to evaluate the vascular anatomy. Carotid stenosis measurements (when applicable) are obtained utilizing NASCET criteria, using the distal internal carotid diameter as the denominator. CONTRAST:  ISOVUE-370 IOPAMIDOL (ISOVUE-370) INJECTION 76% COMPARISON:  None. FINDINGS: CTA NECK FINDINGS Aortic arch: Normal variant aortic arch branching pattern with common origin of the brachiocephalic and left common carotid arteries. Mild arch atherosclerosis. Widely patent arch vessel origins. Right carotid system: Patent with mild soft plaque at the carotid bifurcation not resulting in significant stenosis. Left carotid system: Patent with mild soft plaque at the carotid bifurcation not resulting in significant stenosis. Retropharyngeal course of the proximal ICA. Vertebral arteries: Patent without evidence of stenosis or dissection. Slightly dominant right vertebral artery. Skeleton: Cervical spondylosis with bulky anterior vertebral spurring at C4-5. Other neck: Mild diffuse thyroid heterogeneity with the largest discrete nodule measuring 1.2 cm anteriorly in the left lobe. Upper chest: Clear lung apices. Review of the MIP images confirms the above findings CTA HEAD FINDINGS Anterior circulation: The internal carotid arteries are patent from skull base to carotid termini with mild atherosclerotic irregularity but no significant stenosis. ACAs and MCAs are patent without evidence of proximal branch occlusion or significant proximal stenosis. No aneurysm is identified. Posterior circulation: The intracranial vertebral arteries are  widely patent to the basilar. Patent PICA and SCA origins are identified bilaterally. The basilar artery is widely patent. Posterior communicating arteries are diminutive or absent. PCAs are patent without evidence of significant stenosis. No aneurysm is identified. Venous sinuses: Patent. Anatomic variants: None. Delayed phase: No abnormal enhancement. Review of the MIP images confirms the above findings IMPRESSION: Mild intracranial and cervical atherosclerosis without large vessel occlusion or significant stenosis. Aortic Atherosclerosis (ICD10-I70.0). These results were called by telephone at the time of interpretation on 06/09/2018 at 12:26 pm to Dr. Lorre Nick, who verbally acknowledged these results. Electronically Signed   By: Sebastian Ache M.D.   On: 06/09/2018 12:34   Ct Head Code Stroke Wo Contrast  Result Date: 06/09/2018 CLINICAL DATA:  Code stroke.  Right-sided weakness. EXAM: CT HEAD WITHOUT CONTRAST TECHNIQUE: Contiguous axial images were obtained from the base of the skull through the vertex without intravenous contrast. COMPARISON:  None. FINDINGS: Brain: No definite acute infarct, intracranial hemorrhage, mass, midline shift, or extra-axial fluid collection is identified. Low density anteriorly in the left temporal lobe is attributed to beam hardening. The ventricles and sulci are normal. Vascular: Calcified atherosclerosis at the skull base. No hyperdense vessel. Skull: No fracture or focal osseous lesion. Sinuses/Orbits: Visualized paranasal sinuses and mastoid air cells are clear. Orbits are unremarkable. Other: None. ASPECTS [facility] Stroke Program  Early CT Score) - Ganglionic level infarction (caudate, lentiform nuclei, internal capsule, insula, M1-M3 cortex): 7 - Supraganglionic infarction (M4-M6 cortex): 3 Total score (0-10 with 10 being normal): 10 IMPRESSION: 1. No evidence of acute intracranial abnormality. 2. ASPECTS is 10. These results were called by telephone at the time of  interpretation on 06/09/2018 at 11:57 am to Dr. Lorre Nick, who verbally acknowledged these results. Electronically Signed   By: Sebastian Ache M.D.   On: 06/09/2018 11:57    Procedures Procedures (including critical care time)  Medications Ordered in ED Medications - No data to display   Initial Impression / Assessment and Plan / ED Course  I have reviewed the triage vital signs and the nursing notes.  Pertinent labs & imaging results that were available during my care of the patient were reviewed by me and considered in my medical decision making (see chart for details).    Patient with acute right-sided weakness that started at 9:45 AM.  Also has some slurred speech which seems to be improving.  On exam she does have pronator drift and right leg weakness.  Code stroke was called.  12:15 PM CT head negative.  Patient was seen by tele-neurologist, at this time the patient's symptoms completely resolved.  Questionable TIA.  Patient now ambulating the hallway with no problems.  She is no longer having right-sided weakness.  Will admit  3:17 PM Spoke with hospitalist, will admit pt. Pt updated.   Vitals:   06/09/18 1153 06/09/18 1210 06/09/18 1325 06/09/18 1407  BP:  (!) 148/87  (!) 138/101  Pulse:  79 64 72  Resp:  15 (!) 21 17  SpO2:  100% 100% 100%  Weight: 70.6 kg        Final Clinical Impressions(s) / ED Diagnoses   Final diagnoses:  TIA (transient ischemic attack)  Elevated LFTs    ED Discharge Orders    None       Jaynie Crumble, PA-C 06/09/18 1538    Lorre Nick, MD 06/11/18 1654

## 2018-06-09 NOTE — ED Notes (Signed)
ED TO INPATIENT HANDOFF REPORT  Name/Age/Gender Sheila Walters 62 y.o. female  Code Status Code Status History    Date Active Date Inactive Code Status Order ID Comments User Context   10/09/2017 0932 10/09/2017 1417 Full Code [id number]  Mack Hookhompson, David, MD Inpatient      Home/SNF/Other Home  Chief Complaint stroke symptoms  Level of Care/Admitting Diagnosis ED Disposition    ED Disposition Condition Comment   Admit  [facility] [[id number]]  Level of Care: Telemetry [5]  Admit to tele based on following criteria: Other see comments  Comments: TIA  Diagnosis: TIA (transient ischemic attack) [409811][[id number]]  Admitting Physician: Myrtie NeitherUGAH, NWANNADIYA [[id number]]  Attending Physician: Myrtie NeitherUGAH, NWANNADIYA [[id number]]  PT Class (Do Not Modify): Observation [104]  PT Acc Code (Do Not Modify): Observation [10022]       Medical History Past Medical History:  Diagnosis Date  . Hypertension   . Radial neuropathy, left     Allergies No Known Allergies  IV Location/Drains/Wounds Patient Lines/Drains/Airways Status   Active Line/Drains/Airways    Name:   Placement date:   Placement time:   Site:   Days:   Peripheral IV 06/09/18 Right Antecubital   06/09/18    1128    Antecubital   less than 1          Labs/Imaging Results for orders placed or performed during the hospital encounter of 06/09/18 (from the past 48 hour(s))  CBG monitoring, ED     Status: None   Collection Time: 06/09/18 11:24 AM  Result Value Ref Range   Glucose-Capillary 85 70 - 99 mg/dL  Ethanol     Status: None   Collection Time: 06/09/18 11:33 AM  Result Value Ref Range   Alcohol, Ethyl (B) <10 <10 mg/dL    Comment: (NOTE) Lowest detectable limit for serum alcohol is 10 mg/dL. For medical purposes only. Performed at [facility], [address] [place], [place] [address]   Protime-INR     Status: None   Collection Time: 06/09/18 11:33 AM  Result Value  Ref Range   Prothrombin Time 13.3 11.4 - 15.2 seconds   INR 1.01     Comment: Performed at [facility], [address] [place], [place] [address]  APTT     Status: None   Collection Time: 06/09/18 11:33 AM  Result Value Ref Range   aPTT 27 24 - 36 seconds    Comment: Performed at [facility], [address] [place], [place] [address]  CBC     Status: None   Collection Time: 06/09/18 11:33 AM  Result Value Ref Range   WBC 6.1 4.0 - 10.5 K/uL   RBC 4.82 3.87 - 5.11 MIL/uL   Hemoglobin 13.7 12.0 - 15.0 g/dL   HCT 41.342.9 24.436.0 - 01.046.0 %   MCV 89.0 80.0 - 100.0 fL   MCH 28.4 26.0 - 34.0 pg   MCHC 31.9 30.0 - 36.0 g/dL   RDW 27.214.4 53.611.5 - 64.415.5 %   Platelets 217 150 - 400 K/uL   nRBC 0.0 0.0 - 0.2 %    Comment: Performed at [facility], [address] [place], [place] [address]  Differential     Status: None   Collection Time: 06/09/18 11:33 AM  Result Value Ref Range   Neutrophils Relative % 50 %   Neutro Abs 3.1 1.7 - 7.7 K/uL   Lymphocytes Relative 41 %   Lymphs Abs 2.5 0.7 -  4.0 K/uL   Monocytes Relative 6 %   Monocytes Absolute 0.4 0.1 - 1.0 K/uL   Eosinophils Relative 3 %   Eosinophils Absolute 0.2 0.0 - 0.5 K/uL   Basophils Relative 0 %   Basophils Absolute 0.0 0.0 - 0.1 K/uL   WBC Morphology Abnormal lymphocytes present    Immature Granulocytes 0 %   Abs Immature Granulocytes 0.02 0.00 - 0.07 K/uL    Comment: Performed at [facility], [address] [place], [place] [address]  Comprehensive metabolic panel     Status: Abnormal   Collection Time: 06/09/18 11:33 AM  Result Value Ref Range   Sodium 143 135 - 145 mmol/L   Potassium 3.4 (L) 3.5 - 5.1 mmol/L   Chloride 110 98 - 111 mmol/L   CO2 24 22 - 32 mmol/L   Glucose, Bld 103 (H) 70 - 99 mg/dL   BUN 9 8 - 23 mg/dL   Creatinine, Ser 6.04 0.44 - 1.00 mg/dL   Calcium 8.6 (L) 8.9 - 10.3 mg/dL   Total Protein 7.2 6.5 - 8.1 g/dL   Albumin 3.4  (L) 3.5 - 5.0 g/dL   AST 540 (H) 15 - 41 U/L   ALT 194 (H) 0 - 44 U/L   Alkaline Phosphatase 88 38 - 126 U/L   Total Bilirubin 1.1 0.3 - 1.2 mg/dL   GFR calc non Af Amer >60 >60 mL/min   GFR calc Af Amer >60 >60 mL/min   Anion gap 9 5 - 15    Comment: Performed at [facility], [address] [place], [place] [address]  I-stat troponin, ED     Status: None   Collection Time: 06/09/18 11:38 AM  Result Value Ref Range   Troponin i, poc 0.00 0.00 - 0.08 ng/mL   Comment 3            Comment: Due to the release kinetics of cTnI, a negative result within the first hours of the onset of symptoms does not rule out myocardial infarction with certainty. If myocardial infarction is still suspected, repeat the test at appropriate intervals.   I-Stat Chem 8, ED     Status: Abnormal   Collection Time: 06/09/18 11:41 AM  Result Value Ref Range   Sodium 144 135 - 145 mmol/L   Potassium 3.4 (L) 3.5 - 5.1 mmol/L   Chloride 110 98 - 111 mmol/L   BUN 6 (L) 8 - 23 mg/dL   Creatinine, Ser 1.47 0.44 - 1.00 mg/dL   Glucose, Bld 829 (H) 70 - 99 mg/dL   Calcium, Ion 5.62 (L) 1.15 - 1.40 mmol/L   TCO2 25 22 - 32 mmol/L   Hemoglobin 14.3 12.0 - 15.0 g/dL   HCT 13.0 86.5 - 78.4 %   Ct Angio Head W Or Wo Contrast  Result Date: 06/09/2018 CLINICAL DATA:  Right-sided weakness. EXAM: CT ANGIOGRAPHY HEAD AND NECK TECHNIQUE: Multidetector CT imaging of the head and neck was performed using the standard protocol during bolus administration of intravenous contrast. Multiplanar CT image reconstructions and MIPs were obtained to evaluate the vascular anatomy. Carotid stenosis measurements (when applicable) are obtained utilizing NASCET criteria, using the distal internal carotid diameter as the denominator. CONTRAST:  ISOVUE-370 IOPAMIDOL (ISOVUE-370) INJECTION 76% COMPARISON:  None. FINDINGS: CTA NECK FINDINGS Aortic arch: Normal variant aortic arch branching pattern with common origin of  the brachiocephalic and left common carotid arteries. Mild arch atherosclerosis. Widely patent arch vessel origins. Right carotid system: Patent  with mild soft plaque at the carotid bifurcation not resulting in significant stenosis. Left carotid system: Patent with mild soft plaque at the carotid bifurcation not resulting in significant stenosis. Retropharyngeal course of the proximal ICA. Vertebral arteries: Patent without evidence of stenosis or dissection. Slightly dominant right vertebral artery. Skeleton: Cervical spondylosis with bulky anterior vertebral spurring at C4-5. Other neck: Mild diffuse thyroid heterogeneity with the largest discrete nodule measuring 1.2 cm anteriorly in the left lobe. Upper chest: Clear lung apices. Review of the MIP images confirms the above findings CTA HEAD FINDINGS Anterior circulation: The internal carotid arteries are patent from skull base to carotid termini with mild atherosclerotic irregularity but no significant stenosis. ACAs and MCAs are patent without evidence of proximal branch occlusion or significant proximal stenosis. No aneurysm is identified. Posterior circulation: The intracranial vertebral arteries are widely patent to the basilar. Patent PICA and SCA origins are identified bilaterally. The basilar artery is widely patent. Posterior communicating arteries are diminutive or absent. PCAs are patent without evidence of significant stenosis. No aneurysm is identified. Venous sinuses: Patent. Anatomic variants: None. Delayed phase: No abnormal enhancement. Review of the MIP images confirms the above findings IMPRESSION: Mild intracranial and cervical atherosclerosis without large vessel occlusion or significant stenosis. Aortic Atherosclerosis (ICD10-I70.0). These results were called by telephone at the time of interpretation on 06/09/2018 at 12:26 pm to Dr. Lorre Nick, who verbally acknowledged these results. Electronically Signed   By: Sebastian Ache M.D.   On:  06/09/2018 12:34   Ct Angio Neck W And/or Wo Contrast  Result Date: 06/09/2018 CLINICAL DATA:  Right-sided weakness. EXAM: CT ANGIOGRAPHY HEAD AND NECK TECHNIQUE: Multidetector CT imaging of the head and neck was performed using the standard protocol during bolus administration of intravenous contrast. Multiplanar CT image reconstructions and MIPs were obtained to evaluate the vascular anatomy. Carotid stenosis measurements (when applicable) are obtained utilizing NASCET criteria, using the distal internal carotid diameter as the denominator. CONTRAST:  ISOVUE-370 IOPAMIDOL (ISOVUE-370) INJECTION 76% COMPARISON:  None. FINDINGS: CTA NECK FINDINGS Aortic arch: Normal variant aortic arch branching pattern with common origin of the brachiocephalic and left common carotid arteries. Mild arch atherosclerosis. Widely patent arch vessel origins. Right carotid system: Patent with mild soft plaque at the carotid bifurcation not resulting in significant stenosis. Left carotid system: Patent with mild soft plaque at the carotid bifurcation not resulting in significant stenosis. Retropharyngeal course of the proximal ICA. Vertebral arteries: Patent without evidence of stenosis or dissection. Slightly dominant right vertebral artery. Skeleton: Cervical spondylosis with bulky anterior vertebral spurring at C4-5. Other neck: Mild diffuse thyroid heterogeneity with the largest discrete nodule measuring 1.2 cm anteriorly in the left lobe. Upper chest: Clear lung apices. Review of the MIP images confirms the above findings CTA HEAD FINDINGS Anterior circulation: The internal carotid arteries are patent from skull base to carotid termini with mild atherosclerotic irregularity but no significant stenosis. ACAs and MCAs are patent without evidence of proximal branch occlusion or significant proximal stenosis. No aneurysm is identified. Posterior circulation: The intracranial vertebral arteries are widely patent to the  basilar. Patent PICA and SCA origins are identified bilaterally. The basilar artery is widely patent. Posterior communicating arteries are diminutive or absent. PCAs are patent without evidence of significant stenosis. No aneurysm is identified. Venous sinuses: Patent. Anatomic variants: None. Delayed phase: No abnormal enhancement. Review of the MIP images confirms the above findings IMPRESSION: Mild intracranial and cervical atherosclerosis without large vessel occlusion or significant stenosis. Aortic Atherosclerosis (ICD10-I70.0).  These results were called by telephone at the time of interpretation on 06/09/2018 at 12:26 pm to Dr. Lorre Nick, who verbally acknowledged these results. Electronically Signed   By: Sebastian Ache M.D.   On: 06/09/2018 12:34   Ct Head Code Stroke Wo Contrast  Result Date: 06/09/2018 CLINICAL DATA:  Code stroke.  Right-sided weakness. EXAM: CT HEAD WITHOUT CONTRAST TECHNIQUE: Contiguous axial images were obtained from the base of the skull through the vertex without intravenous contrast. COMPARISON:  None. FINDINGS: Brain: No definite acute infarct, intracranial hemorrhage, mass, midline shift, or extra-axial fluid collection is identified. Low density anteriorly in the left temporal lobe is attributed to beam hardening. The ventricles and sulci are normal. Vascular: Calcified atherosclerosis at the skull base. No hyperdense vessel. Skull: No fracture or focal osseous lesion. Sinuses/Orbits: Visualized paranasal sinuses and mastoid air cells are clear. Orbits are unremarkable. Other: None. ASPECTS [facility] Stroke Program Early CT Score) - Ganglionic level infarction (caudate, lentiform nuclei, internal capsule, insula, M1-M3 cortex): 7 - Supraganglionic infarction (M4-M6 cortex): 3 Total score (0-10 with 10 being normal): 10 IMPRESSION: 1. No evidence of acute intracranial abnormality. 2. ASPECTS is 10. These results were called by telephone at the time of interpretation on  06/09/2018 at 11:57 am to Dr. Lorre Nick, who verbally acknowledged these results. Electronically Signed   By: Sebastian Ache M.D.   On: 06/09/2018 11:57   EKG Interpretation  Date/Time:  Saturday June 09 2018 11:35:16 EST Ventricular Rate:  82 PR Interval:    QRS Duration: 94 QT Interval:  358 QTC Calculation: 419 R Axis:   66 Text Interpretation:  Sinus rhythm Borderline T abnormalities, inferior leads No significant change since last tracing Confirmed by Lorre Nick ([id number]) on 06/09/2018 3:18:22 PM   Pending Labs Unresulted Labs (From admission, onward)    Start     Ordered   06/09/18 1133  Urine rapid drug screen (hosp performed)  ONCE - STAT,   STAT     06/09/18 1132   06/09/18 1133  Urinalysis, Routine w reflex microscopic  ONCE - STAT,   STAT     06/09/18 1132   Signed and Held  HIV antibody (Routine Testing)  Once,   R     Signed and Held   Signed and Held  Basic metabolic panel  Tomorrow morning,   R     Signed and Held   Signed and Held  Comprehensive metabolic panel  Tomorrow morning,   R     Signed and Held   Signed and Held  CBC  Tomorrow morning,   R     Signed and Held          Vitals/Pain Today's Vitals   06/09/18 1220 06/09/18 1325 06/09/18 1407 06/09/18 1441  BP:   (!) 138/101   Pulse:  64 72   Resp:  (!) 21 17   SpO2:  100% 100%   Weight:      PainSc: 0-No pain   10-Worst pain ever    Isolation Precautions No active isolations  Medications Medications  sodium chloride (PF) 0.9 % injection (0 mLs  Hold 06/09/18 1200)  iopamidol (ISOVUE-370) 76 % injection (  Hold 06/09/18 1201)  iopamidol (ISOVUE-370) 76 % injection 100 mL (100 mLs Intravenous Contrast Given 06/09/18 1152)    Mobility walks

## 2018-06-09 NOTE — ED Notes (Signed)
Bed: WA13 Expected date:  Expected time:  Means of arrival:  Comments: Res B 

## 2018-06-09 NOTE — ED Triage Notes (Signed)
Patient woke up this morning with right side weakness. Pt last know well at 0900 am this morning.

## 2018-06-09 NOTE — ED Provider Notes (Signed)
Medical screening examination/treatment/procedure(s) were conducted as a shared visit with non-physician practitioner(s) and myself.  I personally evaluated the patient during the encounter.  None 62 year old female presents with acute onset of right upper extremity weakness as well as some ataxia which began approximately at 9:45 AM.  On exam she has right upper extremity drift.  She has no dysmetria.  No facial asymmetry.  Code stroke initiated and head CT pending as well as neurological evaluation   Lorre NickAllen, Isabele Lollar, MD 06/09/18 1140

## 2018-06-09 NOTE — Consult Note (Signed)
[facility] Tele[facility]urology Consult Services  Date of Service:    06/09/2018 11:47:00  Impression: Transient Ischemic Attack  Comments: -Symptoms seem to have resolved. Current exam is negative for NIHSS 0. -No role for thrombolytic or NeuroIR in this case.  Metrics: Last Known Well: 06/09/2018 09:45:00 [facility] Notification Time: 06/09/2018 11:46:09 Arrival Time: 06/09/2018 11:12:00 Stamp Time: 06/09/2018 11:47:00 Time First Login Attempt: 06/09/2018 11:51:59 Video Start Time: 06/09/2018 11:51:59 Symptoms: R sided weakness NIHSS Start Assessment Time: 06/09/2018 12:02:00 Patient is not a candidate for tPA. Patient was not deemed candidate for tPA thrombolytics because of Symptoms resolved. NIHSS 0.. Video End Time: 06/09/2018 12:11:20 ER Physician notified of the decision on thrombolytics management on 06/09/2018 12:11:30  Recommendations: -Admission for TIA/stroke work-up (Brain MRI, Echocardiogram, Telemetry monitoring, lipid profile, etc.). -Give ASA. -Hold antihypertensives x 24 hours.   Sign Out: Discussed with Emergency Department Provider   ------------------------------------------------------------------------------  History of Present Illness: Patient is a 62 year old female who presented to the ED with symptoms of R sided weakness.  H/o HTN. No h/o stroke. Reports today at ~ 9:45 am developed R sided weakness.  No numbness.  Slightly slurred speech. Symptoms have since resolved on arrival to ED.   Imaging: CT head showed no acute hemorrhage or acute core infarct. CTA head/neck showed no large vessel stenosis or occlusion.  Examination: BP(151/91), Blood Glucose(85) 1A: Level of Consciousness - Alert; keenly responsive + 0 1B: Ask Month and Age - Both Questions Right + 0 1C: Blink Eyes & Squeeze Hands - Performs Both Tasks + 0 2: Test Horizontal Extraocular Movements - Normal + 0 3: Test Visual Fields - No Visual Loss + 0 4: Test  Facial Palsy (Use Grimace if Obtunded) - Normal symmetry + 0 5A: Test Left Arm Motor Drift - No Drift for 10 Seconds + 0 5B: Test Right Arm Motor Drift - No Drift for 10 Seconds + 0 6A: Test Left Leg Motor Drift - No Drift for 5 Seconds + 0 6B: Test Right Leg Motor Drift - No Drift for 5 Seconds + 0 7: Test Limb Ataxia (FNF/Heel-Shin) - No Ataxia + 0 8: Test Sensation - Normal; No sensory loss + 0 9: Test Language/Aphasia - Normal; No aphasia + 0 10: Test Dysarthria - Normal + 0 11: Test Extinction/Inattention - No abnormality + 0 NIHSS Score: 0  Patient was informed the Neurology Consult would happen via TeleHealth consult by way of interactive audio and video telecommunications and consented to receiving care in this manner.  Due to the immediate potential for life-threatening deterioration due to underlying acute neurologic illness, I spent 35 minutes providing critical care. This time includes time for face to face visit via telemedicine, review of medical records, imaging studies and discussion of findings with providers, the patient and/or family.  Dr Mindi SlickerMichael Duron [facility] [phone] Case [id number]

## 2018-06-09 NOTE — ED Notes (Signed)
Patient was ambulate with no any problem at this time per stoke m.d order.

## 2018-06-09 NOTE — H&P (Signed)
History and Physical  Sheila Walters UJW:[id number] DOB: Feb 13, 1956 DOA: 06/09/2018  Referring physician:Kirichenko, Eligha Bridegroom PCP:  Dr. Yetta Barre [facility]:  Patient coming from: Home & is able to ambulate   Chief Complaint: Right-sided weakness  HPI: Sheila Walters is a 62 y.o. female with medical history significant for hypertension hypercholesterolemia, peripheral neuropathy who was in her usual state of health until this morning approximately 9:45 AM she was about to go for a primary care doctor make appointment and she was in her bathroom making up when she suddenly became unable to use her right hand she said that she had trouble putting on make-up and trouble typing and using the phone to call her sister fortunately she had her sister on redial on her phone so she was able to read down and called her sister who noted that she was slurring in her speech when her sister came over and brought her to the emergency room she also reported difficulty walking that her right leg felt weak and was about to buckle.  Her sister noted some abnormal gait.  Patient stated that she has been going to her primary care doctor for years he started her on a cholesterol medication that did not agree with her so it was discontinued on June 01, 2017 because it was causing her right side pain especially the neck pain and arm pain. Patient also complained of diarrhea and vomiting since Friday she has not had any vomiting this morning Patient fell on May 15, 2018 when she became dizzy, she says she busted her lip upper lip.  She stated that she felt dizzy prior to fall falling on 5 November and she thought it was due to her blood pressure being low her primary doctor had changed her blood pressure medications.  She was actually on her way to follow-up on the blood pressure and the lip when she developed the strokelike symptoms this morning she states she still has a knot in it she  is concerned about the lip because she was told by somebody that she needed antibiotics she is also a flight attendant and asked where she is concerned about her appearance.  She also has high blood pressure but she says she has not taken her hydrochlorothiazide due to did not know nausea vomiting and diarrhea that she has been having and she was concerned about her blood pressure being low again. Patient had been seen at [facility] on November 21 which she had a 2D echo and echo had cardiac stress test that were both negative they also did a carotid artery ultrasound but a have not gotten the results yet  ED Course: When patient arrived in the emergency department a code stroke was called a tele-neuro consult was obtained they did not feel that this was an acute stroke.  All of her symptoms had already resolved, they advised to admit her overnight for observation  Review of Systems: . Pt complains of nausea vomiting diarrhea which she has not had any today yet.  Left leg weakness right hand weakness right arm weakness  Pt denies any fever abdominal pain or headache.  Review of systems are otherwise negative   Past Medical History:  Diagnosis Date  . Hypertension   . Radial neuropathy, left    Past Surgical History:  Procedure Laterality Date  . ABDOMINAL HYSTERECTOMY    . BREAST BIOPSY Right   . TUBAL LIGATION    . ULNAR NERVE TRANSPOSITION Left 10/09/2017   Procedure:  LEFT RADIAL NEUROPLASTY;  Surgeon: Mack Hookhompson, David, MD;  Location: [facility];  Service: Orthopedics;  Laterality: Left;    Social History:  reports that she has never smoked. She has never used smokeless tobacco. She reports that she does not drink alcohol or use drugs.   No Known Allergies  No family history on file.    Prior to Admission medications   Medication Sig Start Date End Date Taking? Authorizing Provider  B Complex Vitamins (VITAMIN B COMPLEX PO) Take 1 tablet by mouth daily.    Yes [provider]  diclofenac (VOLTAREN) 75 MG EC tablet Take 75 mg by mouth 2 (two) times daily as needed. 06/01/18  Yes [provider]  hydrochlorothiazide (HYDRODIURIL) 25 MG tablet Take 25 mg by mouth daily.   Yes [provider]  ibuprofen (ADVIL) 200 MG tablet Take 3 tablets (600 mg total) by mouth every 6 (six) hours. 10/09/17  Yes Mack Hookhompson, David, MD  IRON PO Take by mouth.   Yes [provider]  Pyridoxine HCl (VITAMIN B6 PO) Take 1 tablet by mouth daily.   Yes [provider]  acetaminophen (TYLENOL) 325 MG tablet Take 2 tablets (650 mg total) by mouth every 6 (six) hours. Patient not taking: Reported on 06/09/2018 10/09/17   Mack Hookhompson, David, MD  oxyCODONE (ROXICODONE) 5 MG immediate release tablet Take 1 tablet (5 mg total) by mouth every 6 (six) hours as needed for breakthrough pain. Patient not taking: Reported on 06/09/2018 10/09/17   Mack Hookhompson, David, MD    Physical Exam: BP (!) 138/101   Pulse 72   Resp 17   Wt 70.6 kg   SpO2 100%   BMI 25.11 kg/m   Exam:  . General: 62 y.o. year-old female well developed well nourished in no acute distress.  Alert and oriented x3. . Cardiovascular: Regular rate and rhythm with no rubs or gallops.  No thyromegaly or JVD noted.   Marland Kitchen. Respiratory: Clear to auscultation with no wheezes or rales. Good inspiratory effort. . Abdomen: Soft nontender nondistended with normal bowel sounds x4 quadrants. . Musculoskeletal: No lower extremity edema. 2/4 pulses in all 4 extremities. . Skin: No ulcerative lesions noted or rashes, . Psychiatry: Mood is appropriate for condition and setting . Neurology.  Speech is clear, no focal deficit she moves all 4 extremities strength is normal: Right leg weakness with flexion noted in the right leg,           Labs on Admission: Reviewed personally by me Basic Metabolic Panel: Recent Labs  Lab 06/09/18 1133 06/09/18 1141  NA 143 144  K 3.4* 3.4*  CL 110 110  CO2  24  --   GLUCOSE 103* 101*  BUN 9 6*  CREATININE 0.81 0.80  CALCIUM 8.6*  --    Liver Function Tests: Recent Labs  Lab 06/09/18 1133  AST 158*  ALT 194*  ALKPHOS 88  BILITOT 1.1  PROT 7.2  ALBUMIN 3.4*   No results for input(s): LIPASE, AMYLASE in the last 168 hours. No results for input(s): AMMONIA in the last 168 hours. CBC: Recent Labs  Lab 06/09/18 1133 06/09/18 1141  WBC 6.1  --   NEUTROABS 3.1  --   HGB 13.7 14.3  HCT 42.9 42.0  MCV 89.0  --   PLT 217  --    Cardiac Enzymes: No results for input(s): CKTOTAL, CKMB, CKMBINDEX, TROPONINI in the last 168 hours.  BNP (last 3 results) No results for input(s): BNP in  the last 8760 hours.  ProBNP (last 3 results) No results for input(s): PROBNP in the last 8760 hours.  CBG: Recent Labs  Lab 06/09/18 1124  GLUCAP 85    Radiological Exams on Admission: Ct Angio Head W Or Wo Contrast  Result Date: 06/09/2018 CLINICAL DATA:  Right-sided weakness. EXAM: CT ANGIOGRAPHY HEAD AND NECK TECHNIQUE: Multidetector CT imaging of the head and neck was performed using the standard protocol during bolus administration of intravenous contrast. Multiplanar CT image reconstructions and MIPs were obtained to evaluate the vascular anatomy. Carotid stenosis measurements (when applicable) are obtained utilizing NASCET criteria, using the distal internal carotid diameter as the denominator. CONTRAST:  ISOVUE-370 IOPAMIDOL (ISOVUE-370) INJECTION 76% COMPARISON:  None. FINDINGS: CTA NECK FINDINGS Aortic arch: Normal variant aortic arch branching pattern with common origin of the brachiocephalic and left common carotid arteries. Mild arch atherosclerosis. Widely patent arch vessel origins. Right carotid system: Patent with mild soft plaque at the carotid bifurcation not resulting in significant stenosis. Left carotid system: Patent with mild soft plaque at the carotid bifurcation not resulting in significant stenosis. Retropharyngeal  course of the proximal ICA. Vertebral arteries: Patent without evidence of stenosis or dissection. Slightly dominant right vertebral artery. Skeleton: Cervical spondylosis with bulky anterior vertebral spurring at C4-5. Other neck: Mild diffuse thyroid heterogeneity with the largest discrete nodule measuring 1.2 cm anteriorly in the left lobe. Upper chest: Clear lung apices. Review of the MIP images confirms the above findings CTA HEAD FINDINGS Anterior circulation: The internal carotid arteries are patent from skull base to carotid termini with mild atherosclerotic irregularity but no significant stenosis. ACAs and MCAs are patent without evidence of proximal branch occlusion or significant proximal stenosis. No aneurysm is identified. Posterior circulation: The intracranial vertebral arteries are widely patent to the basilar. Patent PICA and SCA origins are identified bilaterally. The basilar artery is widely patent. Posterior communicating arteries are diminutive or absent. PCAs are patent without evidence of significant stenosis. No aneurysm is identified. Venous sinuses: Patent. Anatomic variants: None. Delayed phase: No abnormal enhancement. Review of the MIP images confirms the above findings IMPRESSION: Mild intracranial and cervical atherosclerosis without large vessel occlusion or significant stenosis. Aortic Atherosclerosis (ICD10-I70.0). These results were called by telephone at the time of interpretation on 06/09/2018 at 12:26 pm to Dr. Lorre Nick, who verbally acknowledged these results. Electronically Signed   By: Sebastian Ache M.D.   On: 06/09/2018 12:34   Ct Angio Neck W And/or Wo Contrast  Result Date: 06/09/2018 CLINICAL DATA:  Right-sided weakness. EXAM: CT ANGIOGRAPHY HEAD AND NECK TECHNIQUE: Multidetector CT imaging of the head and neck was performed using the standard protocol during bolus administration of intravenous contrast. Multiplanar CT image reconstructions and MIPs were  obtained to evaluate the vascular anatomy. Carotid stenosis measurements (when applicable) are obtained utilizing NASCET criteria, using the distal internal carotid diameter as the denominator. CONTRAST:  ISOVUE-370 IOPAMIDOL (ISOVUE-370) INJECTION 76% COMPARISON:  None. FINDINGS: CTA NECK FINDINGS Aortic arch: Normal variant aortic arch branching pattern with common origin of the brachiocephalic and left common carotid arteries. Mild arch atherosclerosis. Widely patent arch vessel origins. Right carotid system: Patent with mild soft plaque at the carotid bifurcation not resulting in significant stenosis. Left carotid system: Patent with mild soft plaque at the carotid bifurcation not resulting in significant stenosis. Retropharyngeal course of the proximal ICA. Vertebral arteries: Patent without evidence of stenosis or dissection. Slightly dominant right vertebral artery. Skeleton: Cervical spondylosis with bulky anterior vertebral spurring at C4-5.  Other neck: Mild diffuse thyroid heterogeneity with the largest discrete nodule measuring 1.2 cm anteriorly in the left lobe. Upper chest: Clear lung apices. Review of the MIP images confirms the above findings CTA HEAD FINDINGS Anterior circulation: The internal carotid arteries are patent from skull base to carotid termini with mild atherosclerotic irregularity but no significant stenosis. ACAs and MCAs are patent without evidence of proximal branch occlusion or significant proximal stenosis. No aneurysm is identified. Posterior circulation: The intracranial vertebral arteries are widely patent to the basilar. Patent PICA and SCA origins are identified bilaterally. The basilar artery is widely patent. Posterior communicating arteries are diminutive or absent. PCAs are patent without evidence of significant stenosis. No aneurysm is identified. Venous sinuses: Patent. Anatomic variants: None. Delayed phase: No abnormal enhancement. Review of the MIP images  confirms the above findings IMPRESSION: Mild intracranial and cervical atherosclerosis without large vessel occlusion or significant stenosis. Aortic Atherosclerosis (ICD10-I70.0). These results were called by telephone at the time of interpretation on 06/09/2018 at 12:26 pm to Dr. Lorre Nick, who verbally acknowledged these results. Electronically Signed   By: Sebastian Ache M.D.   On: 06/09/2018 12:34   Ct Head Code Stroke Wo Contrast  Result Date: 06/09/2018 CLINICAL DATA:  Code stroke.  Right-sided weakness. EXAM: CT HEAD WITHOUT CONTRAST TECHNIQUE: Contiguous axial images were obtained from the base of the skull through the vertex without intravenous contrast. COMPARISON:  None. FINDINGS: Brain: No definite acute infarct, intracranial hemorrhage, mass, midline shift, or extra-axial fluid collection is identified. Low density anteriorly in the left temporal lobe is attributed to beam hardening. The ventricles and sulci are normal. Vascular: Calcified atherosclerosis at the skull base. No hyperdense vessel. Skull: No fracture or focal osseous lesion. Sinuses/Orbits: Visualized paranasal sinuses and mastoid air cells are clear. Orbits are unremarkable. Other: None. ASPECTS [facility] Stroke Program Early CT Score) - Ganglionic level infarction (caudate, lentiform nuclei, internal capsule, insula, M1-M3 cortex): 7 - Supraganglionic infarction (M4-M6 cortex): 3 Total score (0-10 with 10 being normal): 10 IMPRESSION: 1. No evidence of acute intracranial abnormality. 2. ASPECTS is 10. These results were called by telephone at the time of interpretation on 06/09/2018 at 11:57 am to Dr. Lorre Nick, who verbally acknowledged these results. Electronically Signed   By: Sebastian Ache M.D.   On: 06/09/2018 11:57    EKG: Independently reviewed. none  Assessment/Plan Present on Admission: . Hypertension . TIA (transient ischemic attack) . Hypercholesterolemia  Principal Problem:   TIA (transient ischemic  attack) Active Problems:   Hypertension   Hypercholesterolemia  1.  Strokelike symptom versus TIA.  Telemetry neurology was consulted did not feel that acute code stroke needed to be called as her symptoms have completely resolved.  She will be admitted to 24-hour observation with neurochecks.  We will also consult speech and physical therapy for evaluation.  I am holding her aspirin and any other anticoagulation for right now.  2.  Hyper transaminasemia unknown cause.  Patient was on cholesterol medicine which she did not tell me which one it was but it caused her to have myalgia and it was discontinued on June 01, 2018 this might be the reason for the hepatic function being elevated.  But I will also check hepatitis profile and obtain ultrasound of the liver.  3.  Hypertension.  Patient has stated that her blood pressure was low and especially with her nausea vomiting so she had not taken her medication today.  Today blood pressure is slightly elevated we will resume  her home blood pressure medication.  4.  Mild hypokalemia.  May be due to her being on diuretics without potassium supplements I have replaced her potassium.  Severity of Illness: The appropriate patient status for this patient is OBSERVATION. Observation status is judged to be reasonable and necessary in order to provide the required intensity of service to ensure the patient's safety. The patient's presenting symptoms, physical exam findings, and initial radiographic and laboratory data in the context of their medical condition is felt to place them at decreased risk for further clinical deterioration. Furthermore, it is anticipated that the patient will be medically stable for discharge from the hospital within 2 midnights of admission. The following factors support the patient status of observation.   " The patient's presenting symptoms include strokelike symptoms. " The physical exam findings include resolved strokelike  symptoms. " The initial radiographic and laboratory data are CT scan negative.     DVT prophylaxis: SCD  Code Status: Full  Family Communication: Sister at bedside  Disposition Plan: Home  Consults called: Tele-neurology  Admission status: Observation    Myrtie Neither MD [facility] Pager [phone]  If 7PM-7AM, please contact night-coverage [email or web address] Password [email or web address]  06/09/2018, 4:01 PM

## 2018-06-10 ENCOUNTER — Observation Stay (HOSPITAL_COMMUNITY): Payer: 59

## 2018-06-10 DIAGNOSIS — G459 Transient cerebral ischemic attack, unspecified: Secondary | ICD-10-CM | POA: Diagnosis not present

## 2018-06-10 DIAGNOSIS — I10 Essential (primary) hypertension: Secondary | ICD-10-CM

## 2018-06-10 DIAGNOSIS — E7800 Pure hypercholesterolemia, unspecified: Secondary | ICD-10-CM | POA: Diagnosis not present

## 2018-06-10 LAB — COMPREHENSIVE METABOLIC PANEL
ALT: 137 U/L — AB (ref 0–44)
AST: 80 U/L — ABNORMAL HIGH (ref 15–41)
Albumin: 3.4 g/dL — ABNORMAL LOW (ref 3.5–5.0)
Alkaline Phosphatase: 78 U/L (ref 38–126)
Anion gap: 6 (ref 5–15)
BUN: 12 mg/dL (ref 8–23)
CO2: 27 mmol/L (ref 22–32)
CREATININE: 0.74 mg/dL (ref 0.44–1.00)
Calcium: 8.8 mg/dL — ABNORMAL LOW (ref 8.9–10.3)
Chloride: 108 mmol/L (ref 98–111)
GFR calc non Af Amer: >60 mL/min (ref >60)
Glucose, Bld: 88 mg/dL (ref 70–99)
Potassium: 3.2 mmol/L — ABNORMAL LOW (ref 3.5–5.1)
Sodium: 141 mmol/L (ref 135–145)
Total Bilirubin: 1 mg/dL (ref 0.3–1.2)
Total Protein: 6.7 g/dL (ref 6.5–8.1)

## 2018-06-10 LAB — CBC
HCT: 38.1 % (ref 36.0–46.0)
Hemoglobin: 12.1 g/dL (ref 12.0–15.0)
MCH: 28.2 pg (ref 26.0–34.0)
MCHC: 31.8 g/dL (ref 30.0–36.0)
MCV: 88.8 fL (ref 80.0–100.0)
Platelets: 200 10*3/uL (ref 150–400)
RBC: 4.29 MIL/uL (ref 3.87–5.11)
RDW: 14.5 % (ref 11.5–15.5)
WBC: 5.8 10*3/uL (ref 4.0–10.5)
nRBC: 0 % (ref 0.0–0.2)

## 2018-06-10 LAB — LIPID PANEL
Cholesterol: 173 mg/dL (ref 0–200)
HDL: 26 mg/dL — ABNORMAL LOW (ref >40)
LDL Cholesterol: 117 mg/dL — ABNORMAL HIGH (ref 0–99)
Total CHOL/HDL Ratio: 6.7 RATIO
Triglycerides: 151 mg/dL — ABNORMAL HIGH (ref <150)
VLDL: 30 mg/dL (ref 0–40)

## 2018-06-10 LAB — HEMOGLOBIN A1C
Hgb A1c MFr Bld: 5.9 % — ABNORMAL HIGH (ref 4.8–5.6)
Mean Plasma Glucose: 122.63 mg/dL

## 2018-06-10 LAB — HIV ANTIBODY (ROUTINE TESTING W REFLEX): HIV Screen 4th Generation wRfx: NONREACTIVE

## 2018-06-10 MED ORDER — POTASSIUM CHLORIDE CRYS ER 20 MEQ PO TBCR
40.0000 meq | EXTENDED_RELEASE_TABLET | Freq: Once | ORAL | Status: AC
  Administered 2018-06-10: 40 meq via ORAL
  Filled 2018-06-10: qty 2

## 2018-06-10 MED ORDER — CLOPIDOGREL BISULFATE 75 MG PO TABS
75.0000 mg | ORAL_TABLET | Freq: Every day | ORAL | Status: DC
  Administered 2018-06-11: 75 mg via ORAL
  Filled 2018-06-10: qty 1

## 2018-06-10 MED ORDER — CLOPIDOGREL BISULFATE 75 MG PO TABS
300.0000 mg | ORAL_TABLET | Freq: Once | ORAL | Status: AC
  Administered 2018-06-10: 300 mg via ORAL
  Filled 2018-06-10: qty 4

## 2018-06-10 MED ORDER — ASPIRIN 81 MG PO CHEW
81.0000 mg | CHEWABLE_TABLET | Freq: Once | ORAL | Status: AC
  Administered 2018-06-10: 81 mg via ORAL
  Filled 2018-06-10: qty 1

## 2018-06-10 NOTE — Progress Notes (Signed)
SLP Cancellation Note  Patient Details Name: Sheila Walters MRN: [id number] DOB: August 04, 1955   Cancelled treatment:       Reason Eval/Treat Not Completed: SLP screened, no needs identified, will sign off. Per neuro notes pts symptoms have resolved and CT head negative. If further SLP f/u needed please reorder.    [name], Riley NearingBonnie Caroline 06/10/2018, 8:33 AM

## 2018-06-10 NOTE — Progress Notes (Addendum)
PROGRESS NOTE    Sheila Walters  ZOX:[id number] DOB: 11-28-1955 DOA: 06/09/2018 PCP: Patient, No Pcp Per   Brief Narrative: Sheila Walters is a 62 y.o. female with a history of hypertension and hypercholesterolemia. She presents secondary to transient right sided weakness and dysarthria.   Assessment & Plan:   Principal Problem:   TIA (transient ischemic attack) Active Problems:   Hypertension   Hypercholesterolemia   TIA CT head/CTA head and neck significant for no acute stroke but significant for mild intracranial/cervical atherosclerosis. PT recommending no follow-up. Patient at baseline. -Transthoracic Echocardiogram pending -Telemetry -Aspirin and plavix -Neuro recs -OT eval -Hemoglobin A1C pending  Essential hypertension -Hold hydrochlorothiazide secondary to permissive hypertension (goal BP <220/120)  Hyperlipidemia Will need statin therapy. Will hold in setting of acute transaminitis. LDL of 117.   DVT prophylaxis: SCDs Code Status:   Code Status: Full Code Family Communication: None at bedside Disposition Plan: Discharge tomorrow pending Transthoracic Echocardiogram    Consultants:   Neurology  Procedures:   None  Antimicrobials:  None    Subjective: No issues today  Objective: Vitals:   06/09/18 1732 06/09/18 2135 06/10/18 0444 06/10/18 1410  BP: 137/79 (!) 140/93 110/69 138/86  Pulse: 61 65 (!) 57 66  Resp: 15 18 16 16   Temp: 98.4 F (36.9 C) 97.7 F (36.5 C) 98.1 F (36.7 C) 97.9 F (36.6 C)  TempSrc: Oral Oral Oral Oral  SpO2:  100% 98% 99%  Weight: 70.6 kg     Height: 5\' 6"  (1.676 m)       Intake/Output Summary (Last 24 hours) at 06/10/2018 1749 Last data filed at 06/10/2018 1400 Gross per 24 hour  Intake 240 ml  Output -  Net 240 ml   Filed Weights   06/09/18 1153 06/09/18 1732  Weight: 70.6 kg 70.6 kg    Examination:  General exam: Appears calm and comfortable Respiratory system: Clear to  auscultation. Respiratory effort normal. Cardiovascular system: S1 & S2 heard, RRR. No murmurs, rubs, gallops or clicks. Gastrointestinal system: Abdomen is nondistended, soft and nontender. No organomegaly or masses felt. Normal bowel sounds heard. Central nervous system: Alert and oriented. No focal neurological deficits. Reflexes diminished and equal. Extremities: No edema. No calf tenderness Skin: No cyanosis. No rashes Psychiatry: Judgement and insight appear normal. Mood & affect appropriate.     Data Reviewed: I have personally reviewed following labs and imaging studies  CBC: Recent Labs  Lab 06/09/18 1133 06/09/18 1141 06/10/18 0411  WBC 6.1  --  5.8  NEUTROABS 3.1  --   --   HGB 13.7 14.3 12.1  HCT 42.9 42.0 38.1  MCV 89.0  --  88.8  PLT 217  --  200   Basic Metabolic Panel: Recent Labs  Lab 06/09/18 1133 06/09/18 1141 06/10/18 0411  NA 143 144 141  K 3.4* 3.4* 3.2*  CL 110 110 108  CO2 24  --  27  GLUCOSE 103* 101* 88  BUN 9 6* 12  CREATININE 0.81 0.80 0.74  CALCIUM 8.6*  --  8.8*   GFR: Estimated Creatinine Clearance: 68.3 mL/min (by C-G formula based on SCr of 0.74 mg/dL). Liver Function Tests: Recent Labs  Lab 06/09/18 1133 06/10/18 0411  AST 158* 80*  ALT 194* 137*  ALKPHOS 88 78  BILITOT 1.1 1.0  PROT 7.2 6.7  ALBUMIN 3.4* 3.4*   No results for input(s): LIPASE, AMYLASE in the last 168 hours. No results for input(s): AMMONIA in the last 168 hours. Coagulation Profile:  Recent Labs  Lab 06/09/18 1133  INR 1.01   Cardiac Enzymes: No results for input(s): CKTOTAL, CKMB, CKMBINDEX, TROPONINI in the last 168 hours. BNP (last 3 results) No results for input(s): PROBNP in the last 8760 hours. HbA1C: No results for input(s): HGBA1C in the last 72 hours. CBG: Recent Labs  Lab 06/09/18 1124  GLUCAP 85   Lipid Profile: Recent Labs    06/10/18 1149  CHOL 173  HDL 26*  LDLCALC 117*  TRIG 151*  CHOLHDL 6.7   Thyroid Function  Tests: No results for input(s): TSH, T4TOTAL, FREET4, T3FREE, THYROIDAB in the last 72 hours. Anemia Panel: No results for input(s): VITAMINB12, FOLATE, FERRITIN, TIBC, IRON, RETICCTPCT in the last 72 hours. Sepsis Labs: No results for input(s): PROCALCITON, LATICACIDVEN in the last 168 hours.  No results found for this or any previous visit (from the past 240 hour(s)).       Radiology Studies: Ct Angio Head W Or Wo Contrast  Result Date: 06/09/2018 CLINICAL DATA:  Right-sided weakness. EXAM: CT ANGIOGRAPHY HEAD AND NECK TECHNIQUE: Multidetector CT imaging of the head and neck was performed using the standard protocol during bolus administration of intravenous contrast. Multiplanar CT image reconstructions and MIPs were obtained to evaluate the vascular anatomy. Carotid stenosis measurements (when applicable) are obtained utilizing NASCET criteria, using the distal internal carotid diameter as the denominator. CONTRAST:  ISOVUE-370 IOPAMIDOL (ISOVUE-370) INJECTION 76% COMPARISON:  None. FINDINGS: CTA NECK FINDINGS Aortic arch: Normal variant aortic arch branching pattern with common origin of the brachiocephalic and left common carotid arteries. Mild arch atherosclerosis. Widely patent arch vessel origins. Right carotid system: Patent with mild soft plaque at the carotid bifurcation not resulting in significant stenosis. Left carotid system: Patent with mild soft plaque at the carotid bifurcation not resulting in significant stenosis. Retropharyngeal course of the proximal ICA. Vertebral arteries: Patent without evidence of stenosis or dissection. Slightly dominant right vertebral artery. Skeleton: Cervical spondylosis with bulky anterior vertebral spurring at C4-5. Other neck: Mild diffuse thyroid heterogeneity with the largest discrete nodule measuring 1.2 cm anteriorly in the left lobe. Upper chest: Clear lung apices. Review of the MIP images confirms the above findings CTA HEAD FINDINGS  Anterior circulation: The internal carotid arteries are patent from skull base to carotid termini with mild atherosclerotic irregularity but no significant stenosis. ACAs and MCAs are patent without evidence of proximal branch occlusion or significant proximal stenosis. No aneurysm is identified. Posterior circulation: The intracranial vertebral arteries are widely patent to the basilar. Patent PICA and SCA origins are identified bilaterally. The basilar artery is widely patent. Posterior communicating arteries are diminutive or absent. PCAs are patent without evidence of significant stenosis. No aneurysm is identified. Venous sinuses: Patent. Anatomic variants: None. Delayed phase: No abnormal enhancement. Review of the MIP images confirms the above findings IMPRESSION: Mild intracranial and cervical atherosclerosis without large vessel occlusion or significant stenosis. Aortic Atherosclerosis (ICD10-I70.0). These results were called by telephone at the time of interpretation on 06/09/2018 at 12:26 pm to Dr. Lorre Nick, who verbally acknowledged these results. Electronically Signed   By: Sebastian Ache M.D.   On: 06/09/2018 12:34   Ct Angio Neck W And/or Wo Contrast  Result Date: 06/09/2018 CLINICAL DATA:  Right-sided weakness. EXAM: CT ANGIOGRAPHY HEAD AND NECK TECHNIQUE: Multidetector CT imaging of the head and neck was performed using the standard protocol during bolus administration of intravenous contrast. Multiplanar CT image reconstructions and MIPs were obtained to evaluate the vascular anatomy. Carotid stenosis measurements (  when applicable) are obtained utilizing NASCET criteria, using the distal internal carotid diameter as the denominator. CONTRAST:  ISOVUE-370 IOPAMIDOL (ISOVUE-370) INJECTION 76% COMPARISON:  None. FINDINGS: CTA NECK FINDINGS Aortic arch: Normal variant aortic arch branching pattern with common origin of the brachiocephalic and left common carotid arteries. Mild arch  atherosclerosis. Widely patent arch vessel origins. Right carotid system: Patent with mild soft plaque at the carotid bifurcation not resulting in significant stenosis. Left carotid system: Patent with mild soft plaque at the carotid bifurcation not resulting in significant stenosis. Retropharyngeal course of the proximal ICA. Vertebral arteries: Patent without evidence of stenosis or dissection. Slightly dominant right vertebral artery. Skeleton: Cervical spondylosis with bulky anterior vertebral spurring at C4-5. Other neck: Mild diffuse thyroid heterogeneity with the largest discrete nodule measuring 1.2 cm anteriorly in the left lobe. Upper chest: Clear lung apices. Review of the MIP images confirms the above findings CTA HEAD FINDINGS Anterior circulation: The internal carotid arteries are patent from skull base to carotid termini with mild atherosclerotic irregularity but no significant stenosis. ACAs and MCAs are patent without evidence of proximal branch occlusion or significant proximal stenosis. No aneurysm is identified. Posterior circulation: The intracranial vertebral arteries are widely patent to the basilar. Patent PICA and SCA origins are identified bilaterally. The basilar artery is widely patent. Posterior communicating arteries are diminutive or absent. PCAs are patent without evidence of significant stenosis. No aneurysm is identified. Venous sinuses: Patent. Anatomic variants: None. Delayed phase: No abnormal enhancement. Review of the MIP images confirms the above findings IMPRESSION: Mild intracranial and cervical atherosclerosis without large vessel occlusion or significant stenosis. Aortic Atherosclerosis (ICD10-I70.0). These results were called by telephone at the time of interpretation on 06/09/2018 at 12:26 pm to Dr. Lorre Nick, who verbally acknowledged these results. Electronically Signed   By: Sebastian Ache M.D.   On: 06/09/2018 12:34   US Abdomen Complete  Result Date:  06/10/2018 CLINICAL DATA:  Abnormal LFTs EXAM: ABDOMEN ULTRASOUND COMPLETE COMPARISON:  None. FINDINGS: Gallbladder: No gallstones or wall thickening visualized. No sonographic Murphy sign noted by sonographer. Common bile duct: Diameter: 4.4 mm Liver: No focal lesion identified. Within normal limits in parenchymal echogenicity. Portal vein is patent on color Doppler imaging with normal direction of blood flow towards the liver. IVC: No abnormality visualized. Pancreas: Visualized portion unremarkable. Spleen: Size and appearance within normal limits. Right Kidney: Length: 11.2 cm. Echogenicity within normal limits. No mass or hydronephrosis visualized. Left Kidney: Length: 11.2 cm. Echogenicity within normal limits. No mass or hydronephrosis visualized. Abdominal aorta: 2.9 cm Other findings: No ascites IMPRESSION: No acute or significant finding by abdominal ultrasound. 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 Recommend followup by ultrasound in 5 years. This recommendation follows ACR consensus guidelines: White Paper of the ACR Incidental Findings Committee II on Vascular Findings. J Am Coll Radiol 2013; 10:789-794. Electronically Signed   By: Judie Petit.  Shick M.D.   On: 06/10/2018 08:30   Ct Head Code Stroke Wo Contrast  Result Date: 06/09/2018 CLINICAL DATA:  Code stroke.  Right-sided weakness. EXAM: CT HEAD WITHOUT CONTRAST TECHNIQUE: Contiguous axial images were obtained from the base of the skull through the vertex without intravenous contrast. COMPARISON:  None. FINDINGS: Brain: No definite acute infarct, intracranial hemorrhage, mass, midline shift, or extra-axial fluid collection is identified. Low density anteriorly in the left temporal lobe is attributed to beam hardening. The ventricles and sulci are normal. Vascular: Calcified atherosclerosis at the skull base.  No hyperdense vessel. Skull: No fracture or focal osseous lesion.  Sinuses/Orbits: Visualized paranasal sinuses and mastoid air cells are clear. Orbits are unremarkable. Other: None. ASPECTS [facility] Stroke Program Early CT Score) - Ganglionic level infarction (caudate, lentiform nuclei, internal capsule, insula, M1-M3 cortex): 7 - Supraganglionic infarction (M4-M6 cortex): 3 Total score (0-10 with 10 being normal): 10 IMPRESSION: 1. No evidence of acute intracranial abnormality. 2. ASPECTS is 10. These results were called by telephone at the time of interpretation on 06/09/2018 at 11:57 am to Dr. Lorre Nick, who verbally acknowledged these results. Electronically Signed   By: Sebastian Ache M.D.   On: 06/09/2018 11:57        Scheduled Meds: . aspirin  81 mg Oral Once  . clopidogrel  300 mg Oral Once   Followed by  . [START ON 06/11/2018] clopidogrel  75 mg Oral Daily  . hydrochlorothiazide  25 mg Oral Daily  . multivitamin with minerals  1 tablet Oral Daily   Continuous Infusions:   LOS: 0 days     Jacquelin Hawking, MD [facility] 06/10/2018, 5:49 PM  If 7PM-7AM, please contact night-coverage [email or web address]

## 2018-06-10 NOTE — Evaluation (Signed)
Physical Therapy Evaluation Patient Details Name: Sheila Walters MRN: [id number] DOB: 12-18-1955 Today's Date: 06/10/2018   History of Present Illness  Sheila Walters is an 62 y.o. female  With PMH HTN who presented to [facility] on 11/30 with RUE weakness.   Clinical Impression  Patient evaluated by Physical Therapy with no further acute PT needs identified. All education has been completed and the patient has no further questions.  See below for any follow-up Physical Therapy or equipment needs. PT is signing off. Thank you for this referral.  Pt doing very well this afternoon; has some mild R proximal shoulder weakness which according to pt is her baseline, she reports a past right shoulder injury, some OA and mild weakness, distally strength WFL and sensation intact throughout; Pt scored 55/56 on the BERG indicating a very low risk for falls; she is ok to d/c from PT standpoint; no f/u indicated at this time; may benefit from eventual OPPT if R shoulder weakness persists    Follow Up Recommendations No PT follow up    Equipment Recommendations  None recommended by PT    Recommendations for Other Services       Precautions / Restrictions Precautions Precautions: None Restrictions Weight Bearing Restrictions: No      Mobility  Bed Mobility Overal bed mobility: Independent                Transfers Overall transfer level: Independent                  Ambulation/Gait Ambulation/Gait assistance: Independent Gait Distance (Feet): 380 Feet Assistive device: None Gait Pattern/deviations: Step-through pattern;WFL(Within Functional Limits)     General Gait Details: no LOB, see balance section for further details  Stairs            Wheelchair Mobility    Modified Rankin (Stroke Patients Only)       Balance     Sitting balance-Leahy Scale: Normal       Standing balance-Leahy Scale: Normal               High level balance  activites: Side stepping;Backward walking;Sudden stops;Head turns;Turns;Direction changes High Level Balance Comments: no LOB with above Standardized Balance Assessment Standardized Balance Assessment : Berg Balance Test Berg Balance Test Sit to Stand: Able to stand without using hands and stabilize independently Standing Unsupported: Able to stand safely 2 minutes Sitting with Back Unsupported but Feet Supported on Floor or Stool: Able to sit safely and securely 2 minutes Stand to Sit: Sits safely with minimal use of hands Transfers: Able to transfer safely, minor use of hands Standing Unsupported with Eyes Closed: Able to stand 10 seconds safely Standing Ubsupported with Feet Together: Able to place feet together independently and stand 1 minute safely From Standing, Reach Forward with Outstretched Arm: Can reach confidently >25 cm (10") From Standing Position, Pick up Object from Floor: Able to pick up shoe safely and easily From Standing Position, Turn to Look Behind Over each Shoulder: Looks behind from both sides and weight shifts well Turn 360 Degrees: Able to turn 360 degrees safely in 4 seconds or less Standing Unsupported, Alternately Place Feet on Step/Stool: Able to stand independently and safely and complete 8 steps in 20 seconds Standing Unsupported, One Foot in Front: Able to plae foot ahead of the other independently and hold 30 seconds Standing on One Leg: Able to lift leg independently and hold > 10 seconds Total Score: 55  Pertinent Vitals/Pain Pain Assessment: No/denies pain    Home Living Family/patient expects to be discharged to:: Private residence Living Arrangements: Alone Available Help at Discharge: Family Type of Home: Apartment       Home Layout: One level Home Equipment: None      Prior Function Level of Independence: Independent               Hand Dominance        Extremity/Trunk Assessment   Upper Extremity Assessment Upper  Extremity Assessment: RUE deficits/detail;LUE deficits/detail RUE Deficits / Details: R shoulder flexion 3+/5, abduction 3+/5, elbow flexion 4/5; grip good RUE Sensation: WNL RUE Coordination: (WFL for finger to nose bil UEs) LUE Deficits / Details: grossly 4 to 5/5 pt reports baseline distal  weakness d/t recent surgery (ULNAR NERVE TRANSPOSITION 10/2017) LUE Sensation: WNL LUE Coordination: WNL    Lower Extremity Assessment Lower Extremity Assessment: (5/5 bil, AROM WNL)    Cervical / Trunk Assessment Cervical / Trunk Assessment: Normal  Communication   Communication: No difficulties  Cognition Arousal/Alertness: Awake/alert Behavior During Therapy: WFL for tasks assessed/performed Overall Cognitive Status: Within Functional Limits for tasks assessed                                        General Comments      Exercises     Assessment/Plan    PT Assessment Patent does not need any further PT services  PT Problem List         PT Treatment Interventions      PT Goals (Current goals can be found in the Care Plan section)  Acute Rehab PT Goals Patient Stated Goal: back to work PT Goal Formulation: All assessment and education complete, DC therapy    Frequency     Barriers to discharge        Co-evaluation               AM-PAC PT "6 Clicks" Mobility  Outcome Measure Help needed turning from your back to your side while in a flat bed without using bedrails?: None Help needed moving from lying on your back to sitting on the side of a flat bed without using bedrails?: None Help needed moving to and from a bed to a chair (including a wheelchair)?: None Help needed standing up from a chair using your arms (e.g., wheelchair or bedside chair)?: None Help needed to walk in hospital room?: None Help needed climbing 3-5 steps with a railing? : None 6 Click Score: 24    End of Session Equipment Utilized During Treatment: Gait belt Activity  Tolerance: Patient tolerated treatment well Patient left: in bed;with call bell/phone within reach;with family/visitor present   PT Visit Diagnosis: Unsteadiness on feet (R26.81)    Time: [id number] PT Time Calculation (min) (ACUTE ONLY): 16 min   Charges:   PT Evaluation $PT Eval Low Complexity: 1 Low          Drucilla Chaletara Glender [place], PT  Pager: [phone] Acute Rehab Dept [facility]): [phone]   06/10/2018   [facility] Medical CenterWILLIAMS,Abrham Maslowski 06/10/2018, 5:13 PM

## 2018-06-10 NOTE — Consult Note (Addendum)
NEURO HOSPITALIST  CONSULT   Requesting Physician: Dr. Caleb Popp    Chief Complaint: RUE weakness/ slurred speech  History obtained from:  Patient    HPI:                                                                                                                                         Sheila Walters is an 62 y.o. female  With PMH HTN who presented to [facility] on 11/30 with RUE weakness.    Patient states that yesterday about 0945 am she was about  To put on makeup and discovered that she was unable to use her right hand. She called her sister. Her sister says she was slurring her speech at that time. She reports difficulty walking, her right leg feels weak, but also that both of her legs feels weak. Denies any CP, SOB,facial droop, vision problems, numbness, tingling, HA, ETOH, drug use, smoking. No prior stroke history. 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 Patient stated that her symptoms lasted from 9:45 am- 3pm, but her speech resolved before 3 pm. Her arm and leg weakness has completely resolved. She said she has no trouble walking to and from th bathroom. Patient did have a 3 day history of nausea and vomiting, so patient did not take her BP medications Thursday nor Friday.  Hospital course:  CTH: no hemorrhage CTA: no LVO BP: 110/69, BG:88   Date last known well: Date: 06/09/2018 Time last known well: Time: 09:45 tPA Given: No: outside of window Modified Rankin: Rankin Score=0 NIHSS:0   Past Medical History:  Diagnosis Date  . Hypertension   . Radial neuropathy, left     Past Surgical History:  Procedure Laterality Date  . ABDOMINAL HYSTERECTOMY    . BREAST BIOPSY Right   . TUBAL LIGATION    . ULNAR NERVE TRANSPOSITION Left 10/09/2017   Procedure: LEFT RADIAL NEUROPLASTY;  Surgeon: Mack Hook, MD;  Location: [facility];  Service: Orthopedics;  Laterality:  Left;    History reviewed. No pertinent family history.       Social History:  reports that she has never smoked. She has never used smokeless tobacco. She reports that she does not drink alcohol or use drugs.  Allergies: No Known Allergies  Medications:  Scheduled: . hydrochlorothiazide  25 mg Oral Daily  . multivitamin with minerals  1 tablet Oral Daily   Continuous:  PRN:   ROS:                                                                                                                                       ROS was performed and is negative except as noted in HPI    General Examination:                                                                                                      Blood pressure 110/69, pulse (!) 57, temperature 98.1 F (36.7 C), temperature source Oral, resp. rate 16, height 5\' 6"  (1.676 m), weight 70.6 kg, SpO2 98 %.  HEENT-  Normocephalic, no lesions, without obvious abnormality.  Normal external eye and conjunctiva. Cardiovascular- S1-S2 audible, pulses palpable throughout  Lungs-no rhonchi or wheezing noted, no excessive working breathing.  Saturations within normal limits on RA Abdomen- All 4 quadrants palpated and non tender Extremities- Warm, dry and intact Musculoskeletal-no joint tenderness, deformity or swelling Skin-warm and dry, no hyperpigmentation, vitiligo, or suspicious lesions  Neurological Examination Mental Status: Alert, oriented, thought content appropriate. No dysarthria noted. Patient has an accent. Speech fluent without evidence of aphasia.  Able to follow  commands without difficulty. Cranial Nerves: II: Visual fields grossly normal,  III,IV, VI: ptosis not present, extra-ocular motions intact bilaterally, pupils equal, round, reactive to light and accommodation V,VII: smile symmetric, facial light  touch sensation normal bilaterally VIII: hearing normal bilaterally IX,X: uvula rises symmetrically XI: bilateral shoulder shrug XII: midline tongue extension Motor: Right : Upper extremity   5/5  Left:     Upper extremity   5/5  Lower extremity   5/5   Lower extremity   5/5 Tone and bulk:normal tone throughout; no atrophy noted Sensory:  light touch intact throughout, bilaterally Deep Tendon Reflexes: 2+ and symmetric biceps and patella Plantars: Right: downgoing   Left: downgoing Cerebellar: normal finger-to-nose, normal rapid alternating movements and normal heel-to-shin test Gait: deferred   Lab Results: Basic Metabolic Panel: Recent Labs  Lab 06/09/18 1133 06/09/18 1141 06/10/18 0411  NA 143 144 141  K 3.4* 3.4* 3.2*  CL 110 110 108  CO2 24  --  27  GLUCOSE 103* 101* 88  BUN 9 6* 12  CREATININE 0.81 0.80 0.74  CALCIUM 8.6*  --  8.8*    CBC: Recent Labs  Lab 06/09/18 1133  06/09/18 1141 06/10/18 0411  WBC 6.1  --  5.8  NEUTROABS 3.1  --   --   HGB 13.7 14.3 12.1  HCT 42.9 42.0 38.1  MCV 89.0  --  88.8  PLT 217  --  200     CBG: Recent Labs  Lab 06/09/18 1124  GLUCAP 85    Imaging: Ct Angio Head W Or Wo Contrast  Result Date: 06/09/2018 CLINICAL DATA:  Right-sided weakness. EXAM: CT ANGIOGRAPHY HEAD AND NECK TECHNIQUE: Multidetector CT imaging of the head and neck was performed using the standard protocol during bolus administration of intravenous contrast. Multiplanar CT image reconstructions and MIPs were obtained to evaluate the vascular anatomy. Carotid stenosis measurements (when applicable) are obtained utilizing NASCET criteria, using the distal internal carotid diameter as the denominator. CONTRAST:  ISOVUE-370 IOPAMIDOL (ISOVUE-370) INJECTION 76% COMPARISON:  None. FINDINGS: CTA NECK FINDINGS Aortic arch: Normal variant aortic arch branching pattern with common origin of the brachiocephalic and left common carotid arteries. Mild arch  atherosclerosis. Widely patent arch vessel origins. Right carotid system: Patent with mild soft plaque at the carotid bifurcation not resulting in significant stenosis. Left carotid system: Patent with mild soft plaque at the carotid bifurcation not resulting in significant stenosis. Retropharyngeal course of the proximal ICA. Vertebral arteries: Patent without evidence of stenosis or dissection. Slightly dominant right vertebral artery. Skeleton: Cervical spondylosis with bulky anterior vertebral spurring at C4-5. Other neck: Mild diffuse thyroid heterogeneity with the largest discrete nodule measuring 1.2 cm anteriorly in the left lobe. Upper chest: Clear lung apices. Review of the MIP images confirms the above findings CTA HEAD FINDINGS Anterior circulation: The internal carotid arteries are patent from skull base to carotid termini with mild atherosclerotic irregularity but no significant stenosis. ACAs and MCAs are patent without evidence of proximal branch occlusion or significant proximal stenosis. No aneurysm is identified. Posterior circulation: The intracranial vertebral arteries are widely patent to the basilar. Patent PICA and SCA origins are identified bilaterally. The basilar artery is widely patent. Posterior communicating arteries are diminutive or absent. PCAs are patent without evidence of significant stenosis. No aneurysm is identified. Venous sinuses: Patent. Anatomic variants: None. Delayed phase: No abnormal enhancement. Review of the MIP images confirms the above findings IMPRESSION: Mild intracranial and cervical atherosclerosis without large vessel occlusion or significant stenosis. Aortic Atherosclerosis (ICD10-I70.0). These results were called by telephone at the time of interpretation on 06/09/2018 at 12:26 pm to Dr. Lorre Nick, who verbally acknowledged these results. Electronically Signed   By: Sebastian Ache M.D.   On: 06/09/2018 12:34   Ct Angio Neck W And/or Wo Contrast  Result  Date: 06/09/2018 CLINICAL DATA:  Right-sided weakness. EXAM: CT ANGIOGRAPHY HEAD AND NECK TECHNIQUE: Multidetector CT imaging of the head and neck was performed using the standard protocol during bolus administration of intravenous contrast. Multiplanar CT image reconstructions and MIPs were obtained to evaluate the vascular anatomy. Carotid stenosis measurements (when applicable) are obtained utilizing NASCET criteria, using the distal internal carotid diameter as the denominator. CONTRAST:  ISOVUE-370 IOPAMIDOL (ISOVUE-370) INJECTION 76% COMPARISON:  None. FINDINGS: CTA NECK FINDINGS Aortic arch: Normal variant aortic arch branching pattern with common origin of the brachiocephalic and left common carotid arteries. Mild arch atherosclerosis. Widely patent arch vessel origins. Right carotid system: Patent with mild soft plaque at the carotid bifurcation not resulting in significant stenosis. Left carotid system: Patent with mild soft plaque at the carotid bifurcation not resulting in significant stenosis. Retropharyngeal course of the proximal  ICA. Vertebral arteries: Patent without evidence of stenosis or dissection. Slightly dominant right vertebral artery. Skeleton: Cervical spondylosis with bulky anterior vertebral spurring at C4-5. Other neck: Mild diffuse thyroid heterogeneity with the largest discrete nodule measuring 1.2 cm anteriorly in the left lobe. Upper chest: Clear lung apices. Review of the MIP images confirms the above findings CTA HEAD FINDINGS Anterior circulation: The internal carotid arteries are patent from skull base to carotid termini with mild atherosclerotic irregularity but no significant stenosis. ACAs and MCAs are patent without evidence of proximal branch occlusion or significant proximal stenosis. No aneurysm is identified. Posterior circulation: The intracranial vertebral arteries are widely patent to the basilar. Patent PICA and SCA origins are identified bilaterally. The  basilar artery is widely patent. Posterior communicating arteries are diminutive or absent. PCAs are patent without evidence of significant stenosis. No aneurysm is identified. Venous sinuses: Patent. Anatomic variants: None. Delayed phase: No abnormal enhancement. Review of the MIP images confirms the above findings IMPRESSION: Mild intracranial and cervical atherosclerosis without large vessel occlusion or significant stenosis. Aortic Atherosclerosis (ICD10-I70.0). These results were called by telephone at the time of interpretation on 06/09/2018 at 12:26 pm to Dr. Lorre NickAnthony Allen, who verbally acknowledged these results. Electronically Signed   By: Sebastian AcheAllen  Grady M.D.   On: 06/09/2018 12:34   [place] Abdomen Complete  Result Date: 06/10/2018 CLINICAL DATA:  Abnormal LFTs EXAM: ABDOMEN ULTRASOUND COMPLETE COMPARISON:  None. FINDINGS: Gallbladder: No gallstones or wall thickening visualized. No sonographic Murphy sign noted by sonographer. Common bile duct: Diameter: 4.4 mm Liver: No focal lesion identified. Within normal limits in parenchymal echogenicity. Portal vein is patent on color Doppler imaging with normal direction of blood flow towards the liver. IVC: No abnormality visualized. Pancreas: Visualized portion unremarkable. Spleen: Size and appearance within normal limits. Right Kidney: Length: 11.2 cm. Echogenicity within normal limits. No mass or hydronephrosis visualized. Left Kidney: Length: 11.2 cm. Echogenicity within normal limits. No mass or hydronephrosis visualized. Abdominal aorta: 2.9 cm Other findings: No ascites IMPRESSION: No acute or significant finding by abdominal ultrasound. 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 Recommend followup by ultrasound in 5 years. This recommendation follows ACR consensus guidelines: White Paper of the ACR Incidental Findings Committee II on Vascular Findings. J Am Coll Radiol 2013; 10:789-794.  Electronically Signed   By: Judie PetitM.  Shick M.D.   On: 06/10/2018 08:30   Ct Head Code Stroke Wo Contrast  Result Date: 06/09/2018 CLINICAL DATA:  Code stroke.  Right-sided weakness. EXAM: CT HEAD WITHOUT CONTRAST TECHNIQUE: Contiguous axial images were obtained from the base of the skull through the vertex without intravenous contrast. COMPARISON:  None. FINDINGS: Brain: No definite acute infarct, intracranial hemorrhage, mass, midline shift, or extra-axial fluid collection is identified. Low density anteriorly in the left temporal lobe is attributed to beam hardening. The ventricles and sulci are normal. Vascular: Calcified atherosclerosis at the skull base. No hyperdense vessel. Skull: No fracture or focal osseous lesion. Sinuses/Orbits: Visualized paranasal sinuses and mastoid air cells are clear. Orbits are unremarkable. Other: None. ASPECTS [facility]([place] Stroke Program Early CT Score) - Ganglionic level infarction (caudate, lentiform nuclei, internal capsule, insula, M1-M3 cortex): 7 - Supraganglionic infarction (M4-M6 cortex): 3 Total score (0-10 with 10 being normal): 10 IMPRESSION: 1. No evidence of acute intracranial abnormality. 2. ASPECTS is 10. These results were called by telephone at the time of interpretation on 06/09/2018 at 11:57 am to Dr. Lorre NickAnthony Allen, who verbally acknowledged these results. Electronically  Signed   By: Sebastian Ache M.D.   On: 06/09/2018 11:57       Assessment: 62 y.o. female With PMH HTN who presented to [facility] on 11/30 with RUE weakness.  CTH: no hemorrhage. CTA: no LVO. ECHO: pending. Most likely TIA given resolution of symptoms. Complete the stroke work-up.  Stroke Risk Factors - hypertension    Recommendations: --CTA ( done)  --Echocardiogram -- ASA 81 mg -- High intensity Statin if LDL > 70 -- HgbA1c, fasting lipid panel -- PT consult, OT consult, Speech consult --Telemetry monitoring --Frequent neuro checks --Stroke swallow screen   --please page stroke NP  Or   PA  Or MD from 8am -4 pm  as this patient from this time will be  followed by the stroke.   You can look them up on [email or web address]  Password [email or web address]  Valentina Lucks, MSN, NP-C [facility] [phone]  06/10/2018, 10:47 AM   Attending physician note to follow with Assessment and plan .   I have seen the patient and reviewed the above note.  She had right-sided weakness that is pretty characteristic of TIA.    She does have an ABCD 2 score of 4 for unilateral weakness, duration greater than an hour, age greater than 60.  Given this, I would favor dual antiplatelet therapy for 3 weeks followed by aspirin monotherapy.  I would also favor getting an echocardiogram to assess for embolic sources.    She has had difficulty tolerating atorvastatin in the past, could try another agent as she would likely benefit from statin.  Vascular imaging without embolic source.  If her echocardiogram does not show an embolic source, she can be discharged.  Please call if the echocardiogram reveals an embolic source.  Ritta Slot, MD [facility] [phone]  If 7pm- 7am, please page neurology on call as listed in AMION.

## 2018-06-11 ENCOUNTER — Observation Stay (HOSPITAL_BASED_OUTPATIENT_CLINIC_OR_DEPARTMENT_OTHER): Payer: 59

## 2018-06-11 ENCOUNTER — Other Ambulatory Visit: Payer: Self-pay | Admitting: Physician Assistant

## 2018-06-11 DIAGNOSIS — E7800 Pure hypercholesterolemia, unspecified: Secondary | ICD-10-CM | POA: Diagnosis not present

## 2018-06-11 DIAGNOSIS — I10 Essential (primary) hypertension: Secondary | ICD-10-CM | POA: Diagnosis not present

## 2018-06-11 DIAGNOSIS — I639 Cerebral infarction, unspecified: Secondary | ICD-10-CM

## 2018-06-11 DIAGNOSIS — G459 Transient cerebral ischemic attack, unspecified: Secondary | ICD-10-CM | POA: Diagnosis not present

## 2018-06-11 LAB — HEPATIC FUNCTION PANEL
ALBUMIN: 3.6 g/dL (ref 3.5–5.0)
ALT: 98 U/L — ABNORMAL HIGH (ref 0–44)
AST: 43 U/L — ABNORMAL HIGH (ref 15–41)
Alkaline Phosphatase: 84 U/L (ref 38–126)
Bilirubin, Direct: 0.1 mg/dL (ref 0.0–0.2)
Indirect Bilirubin: 0.9 mg/dL (ref 0.3–0.9)
Total Bilirubin: 1 mg/dL (ref 0.3–1.2)
Total Protein: 7.2 g/dL (ref 6.5–8.1)

## 2018-06-11 LAB — HEPATITIS A ANTIBODY, IGM: Hep A IgM: NEGATIVE

## 2018-06-11 LAB — HEPATITIS B CORE ANTIBODY, IGM: Hep B C IgM: NEGATIVE

## 2018-06-11 LAB — HEPATITIS B E ANTIGEN: Hep B E Ag: NEGATIVE

## 2018-06-11 LAB — HEPATITIS B E ANTIBODY: Hep B E Ab: NEGATIVE

## 2018-06-11 LAB — HEPATITIS B SURFACE ANTIBODY, QUANTITATIVE: Hep B S AB Quant (Post): 22.6 m[IU]/mL (ref >9.9)

## 2018-06-11 LAB — ECHOCARDIOGRAM COMPLETE
Height: 66 in
Weight: 2488.9052 oz

## 2018-06-11 LAB — HEPATITIS A ANTIBODY, TOTAL: Hep A Total Ab: NEGATIVE

## 2018-06-11 MED ORDER — CLOPIDOGREL BISULFATE 75 MG PO TABS: 75.0000 mg | ORAL_TABLET | Freq: Every day | ORAL | 0 refills | Status: AC

## 2018-06-11 MED ORDER — ASPIRIN 81 MG PO TABS: 81.0000 mg | ORAL_TABLET | Freq: Every day | ORAL | Status: AC

## 2018-06-11 MED ORDER — STROKE: EARLY STAGES OF RECOVERY BOOK
Freq: Once | Status: DC
  Filled 2018-06-11: qty 1

## 2018-06-11 NOTE — Progress Notes (Signed)
  Echocardiogram 2D Echocardiogram has been performed.  Sheila ChimesWendy  Shelden Walters 06/11/2018, 8:59 AM

## 2018-06-11 NOTE — Discharge Summary (Signed)
Physician Discharge Summary  Alyssabeth Bruster QMV:[id number] DOB: [birth year] DOA: 06/09/2018  PCP: Knox Royalty, MD  Admit date: 06/09/2018 Discharge date: 06/11/2018  Admitted From: Home Disposition: Home  Recommendations for Outpatient Follow-up:  1. Follow up with PCP in 1 week 2. Neurology/stroke team follow up in 5-6 weeks 3. Recommend starting Crestor once AST/ALT elevation resolved 4. Please obtain BMP/CBC in one week 5. Asprin/plavix for three weeks, followed by aspirin indefinitely 6. Cardiology follow-up for 30 day event monitor 7. Please follow up on the following pending results: None  Home Health: None Equipment/Devices: None  Discharge Condition: Stable CODE STATUS: Full code Diet recommendation: Heart healthy   Brief/Interim Summary:  Admission HPI written by Myrtie Neither, MD   Chief Complaint: Right-sided weakness  HPI: Sheila Walters is a 62 y.o. female with medical history significant for hypertension hypercholesterolemia, peripheral neuropathy who was in her usual state of health until this morning approximately 9:45 AM she was about to go for a primary care doctor make appointment and she was in her bathroom making up when she suddenly became unable to use her right hand she said that she had trouble putting on make-up and trouble typing and using the phone to call her sister fortunately she had her sister on redial on her phone so she was able to read down and called her sister who noted that she was slurring in her speech when her sister came over and brought her to the emergency room she also reported difficulty walking that her right leg felt weak and was about to buckle.  Her sister noted some abnormal gait.  Patient stated that she has been going to her primary care doctor for years he started her on a cholesterol medication that did not agree with her so it was discontinued on June 01, 2017 because it was causing her right  side pain especially the neck pain and arm pain. Patient also complained of diarrhea and vomiting since Friday she has not had any vomiting this morning Patient fell on May 15, 2018 when she became dizzy, she says she busted her lip upper lip.  She stated that she felt dizzy prior to fall falling on 5 November and she thought it was due to her blood pressure being low her primary doctor had changed her blood pressure medications.  She was actually on her way to follow-up on the blood pressure and the lip when she developed the strokelike symptoms this morning she states she still has a knot in it she is concerned about the lip because she was told by somebody that she needed antibiotics she is also a flight attendant and asked where she is concerned about her appearance.  She also has high blood pressure but she says she has not taken her hydrochlorothiazide due to did not know nausea vomiting and diarrhea that she has been having and she was concerned about her blood pressure being low again. Patient had been seen at [facility] on November 21 which she had a 2D echo and echo had cardiac stress test that were both negative they also did a carotid artery ultrasound but a have not gotten the results yet  ED Course: When patient arrived in the emergency department a code stroke was called a tele-neuro consult was obtained they did not feel that this was an acute stroke.  All of her symptoms had already resolved, they advised to admit her overnight for observation   Hospital course:  TIA CT  head/CTA head and neck significant for no acute stroke but significant for mild intracranial/cervical atherosclerosis. PT recommending no follow-up. Patient at baseline. No atrial fibrillation on telemetry. Plavix load given and started on Aspirin 81 mg. Hemoglobin A1C of 5.9%. Transthoracic Echocardiogram significant for no visible thrombus. Neurology recommending 3 weeks of dual antiplatelet therapy  followed by aspirin alone  Essential hypertension Held hydrochlorothiazide secondary to permissive hypertension. Resume on discharge.  Hyperlipidemia Will need statin therapy. Will hold in setting of acute transaminitis. LDL of 117. Crestor 20 mg after improvement elevated AST/ALT as patient has had an adverse reaction to Lipitor  Discharge Diagnoses:  Principal Problem:   TIA (transient ischemic attack) Active Problems:   Hypertension   Hypercholesterolemia    Discharge Instructions  Discharge Instructions    Diet - low sodium heart healthy   Complete by:  As directed    Increase activity slowly   Complete by:  As directed      Allergies as of 06/11/2018   No Known Allergies     Medication List    STOP taking these medications   diclofenac 75 MG EC tablet Commonly known as:  VOLTAREN   oxyCODONE 5 MG immediate release tablet Commonly known as:  Oxy IR/ROXICODONE     TAKE these medications   acetaminophen 325 MG tablet Commonly known as:  TYLENOL Take 2 tablets (650 mg total) by mouth every 6 (six) hours.   aspirin 81 MG tablet Take 1 tablet (81 mg total) by mouth daily.   clopidogrel 75 MG tablet Commonly known as:  PLAVIX Take 1 tablet (75 mg total) by mouth daily for 20 days. Start taking on:  06/12/2018   hydrochlorothiazide 25 MG tablet Commonly known as:  HYDRODIURIL Take 25 mg by mouth daily.   ibuprofen 200 MG tablet Commonly known as:  ADVIL,MOTRIN Take 3 tablets (600 mg total) by mouth every 6 (six) hours.   IRON PO Take by mouth.   VITAMIN B COMPLEX PO Take 1 tablet by mouth daily.   VITAMIN B6 PO Take 1 tablet by mouth daily.      Follow-up Information    Micki Riley, MD. Schedule an appointment as soon as possible for a visit in 5 week(s).   Specialties:  Neurology, Radiology Contact information: [address] [place] [address] [phone]        Knox Royalty, MD. Schedule an appointment as soon as  possible for a visit in 1 week(s).   Specialty:  Family Medicine Why:  Repeat Complete Metabolic Panel to check liver tests Contact information: [address] [place] [address] [phone]          No Known Allergies  Consultations:  Neurology   Procedures/Studies: Ct Angio Head W Or Wo Contrast  Result Date: 06/09/2018 CLINICAL DATA:  Right-sided weakness. EXAM: CT ANGIOGRAPHY HEAD AND NECK TECHNIQUE: Multidetector CT imaging of the head and neck was performed using the standard protocol during bolus administration of intravenous contrast. Multiplanar CT image reconstructions and MIPs were obtained to evaluate the vascular anatomy. Carotid stenosis measurements (when applicable) are obtained utilizing NASCET criteria, using the distal internal carotid diameter as the denominator. CONTRAST:  ISOVUE-370 IOPAMIDOL (ISOVUE-370) INJECTION 76% COMPARISON:  None. FINDINGS: CTA NECK FINDINGS Aortic arch: Normal variant aortic arch branching pattern with common origin of the brachiocephalic and left common carotid arteries. Mild arch atherosclerosis. Widely patent arch vessel origins. Right carotid system: Patent with mild soft plaque at the carotid bifurcation not resulting  in significant stenosis. Left carotid system: Patent with mild soft plaque at the carotid bifurcation not resulting in significant stenosis. Retropharyngeal course of the proximal ICA. Vertebral arteries: Patent without evidence of stenosis or dissection. Slightly dominant right vertebral artery. Skeleton: Cervical spondylosis with bulky anterior vertebral spurring at C4-5. Other neck: Mild diffuse thyroid heterogeneity with the largest discrete nodule measuring 1.2 cm anteriorly in the left lobe. Upper chest: Clear lung apices. Review of the MIP images confirms the above findings CTA HEAD FINDINGS Anterior circulation: The internal carotid arteries are patent from skull base to carotid termini with mild atherosclerotic  irregularity but no significant stenosis. ACAs and MCAs are patent without evidence of proximal branch occlusion or significant proximal stenosis. No aneurysm is identified. Posterior circulation: The intracranial vertebral arteries are widely patent to the basilar. Patent PICA and SCA origins are identified bilaterally. The basilar artery is widely patent. Posterior communicating arteries are diminutive or absent. PCAs are patent without evidence of significant stenosis. No aneurysm is identified. Venous sinuses: Patent. Anatomic variants: None. Delayed phase: No abnormal enhancement. Review of the MIP images confirms the above findings IMPRESSION: Mild intracranial and cervical atherosclerosis without large vessel occlusion or significant stenosis. Aortic Atherosclerosis (ICD10-I70.0). These results were called by telephone at the time of interpretation on 06/09/2018 at 12:26 pm to Dr. Lorre NickAnthony Allen, who verbally acknowledged these results. Electronically Signed   By: Sebastian AcheAllen  Grady M.D.   On: 06/09/2018 12:34   Ct Angio Neck W And/or Wo Contrast  Result Date: 06/09/2018 CLINICAL DATA:  Right-sided weakness. EXAM: CT ANGIOGRAPHY HEAD AND NECK TECHNIQUE: Multidetector CT imaging of the head and neck was performed using the standard protocol during bolus administration of intravenous contrast. Multiplanar CT image reconstructions and MIPs were obtained to evaluate the vascular anatomy. Carotid stenosis measurements (when applicable) are obtained utilizing NASCET criteria, using the distal internal carotid diameter as the denominator. CONTRAST:  100mL ISOVUE-370 IOPAMIDOL (ISOVUE-370) INJECTION 76% COMPARISON:  None. FINDINGS: CTA NECK FINDINGS Aortic arch: Normal variant aortic arch branching pattern with common origin of the brachiocephalic and left common carotid arteries. Mild arch atherosclerosis. Widely patent arch vessel origins. Right carotid system: Patent with mild soft plaque at the carotid bifurcation  not resulting in significant stenosis. Left carotid system: Patent with mild soft plaque at the carotid bifurcation not resulting in significant stenosis. Retropharyngeal course of the proximal ICA. Vertebral arteries: Patent without evidence of stenosis or dissection. Slightly dominant right vertebral artery. Skeleton: Cervical spondylosis with bulky anterior vertebral spurring at C4-5. Other neck: Mild diffuse thyroid heterogeneity with the largest discrete nodule measuring 1.2 cm anteriorly in the left lobe. Upper chest: Clear lung apices. Review of the MIP images confirms the above findings CTA HEAD FINDINGS Anterior circulation: The internal carotid arteries are patent from skull base to carotid termini with mild atherosclerotic irregularity but no significant stenosis. ACAs and MCAs are patent without evidence of proximal branch occlusion or significant proximal stenosis. No aneurysm is identified. Posterior circulation: The intracranial vertebral arteries are widely patent to the basilar. Patent PICA and SCA origins are identified bilaterally. The basilar artery is widely patent. Posterior communicating arteries are diminutive or absent. PCAs are patent without evidence of significant stenosis. No aneurysm is identified. Venous sinuses: Patent. Anatomic variants: None. Delayed phase: No abnormal enhancement. Review of the MIP images confirms the above findings IMPRESSION: Mild intracranial and cervical atherosclerosis without large vessel occlusion or significant stenosis. Aortic Atherosclerosis (ICD10-I70.0). These results were called by telephone at the time of  interpretation on 06/09/2018 at 12:26 pm to Dr. Lorre Nick, who verbally acknowledged these results. Electronically Signed   By: Sebastian Ache M.D.   On: 06/09/2018 12:34   US Abdomen Complete  Result Date: 06/10/2018 CLINICAL DATA:  Abnormal LFTs EXAM: ABDOMEN ULTRASOUND COMPLETE COMPARISON:  None. FINDINGS: Gallbladder: No gallstones or wall  thickening visualized. No sonographic Murphy sign noted by sonographer. Common bile duct: Diameter: 4.4 mm Liver: No focal lesion identified. Within normal limits in parenchymal echogenicity. Portal vein is patent on color Doppler imaging with normal direction of blood flow towards the liver. IVC: No abnormality visualized. Pancreas: Visualized portion unremarkable. Spleen: Size and appearance within normal limits. Right Kidney: Length: 11.2 cm. Echogenicity within normal limits. No mass or hydronephrosis visualized. Left Kidney: Length: 11.2 cm. Echogenicity within normal limits. No mass or hydronephrosis visualized. Abdominal aorta: 2.9 cm Other findings: No ascites IMPRESSION: No acute or significant finding by abdominal ultrasound. 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 Recommend followup by ultrasound in 5 years. This recommendation follows ACR consensus guidelines: White Paper of the ACR Incidental Findings Committee II on Vascular Findings. J Am Coll Radiol 2013; 10:789-794. Electronically Signed   By: Judie Petit.  Shick M.D.   On: 06/10/2018 08:30   Ct Head Code Stroke Wo Contrast  Result Date: 06/09/2018 CLINICAL DATA:  Code stroke.  Right-sided weakness. EXAM: CT HEAD WITHOUT CONTRAST TECHNIQUE: Contiguous axial images were obtained from the base of the skull through the vertex without intravenous contrast. COMPARISON:  None. FINDINGS: Brain: No definite acute infarct, intracranial hemorrhage, mass, midline shift, or extra-axial fluid collection is identified. Low density anteriorly in the left temporal lobe is attributed to beam hardening. The ventricles and sulci are normal. Vascular: Calcified atherosclerosis at the skull base. No hyperdense vessel. Skull: No fracture or focal osseous lesion. Sinuses/Orbits: Visualized paranasal sinuses and mastoid air cells are clear. Orbits are unremarkable. Other: None. ASPECTS [facility] Stroke Program Early  CT Score) - Ganglionic level infarction (caudate, lentiform nuclei, internal capsule, insula, M1-M3 cortex): 7 - Supraganglionic infarction (M4-M6 cortex): 3 Total score (0-10 with 10 being normal): 10 IMPRESSION: 1. No evidence of acute intracranial abnormality. 2. ASPECTS is 10. These results were called by telephone at the time of interpretation on 06/09/2018 at 11:57 am to Dr. Lorre Nick, who verbally acknowledged these results. Electronically Signed   By: Sebastian Ache M.D.   On: 06/09/2018 11:57     Transthoracic Echocardiogram (12/2) Study Conclusions  - Left ventricle: The cavity size was normal. Systolic function was   normal. The estimated ejection fraction was in the range of 55%   to 60%. Wall motion was normal; there were no regional wall   motion abnormalities. Left ventricular diastolic function   parameters were normal.   Subjective: Headache today. Otherwise no concerns  Discharge Exam: Vitals:   06/10/18 2226 06/11/18 0427  BP: 108/60 124/80  Pulse: 62 73  Resp: 16 16  Temp: 98.2 F (36.8 C) 98.1 F (36.7 C)  SpO2: 96% 100%   Vitals:   06/10/18 0444 06/10/18 1410 06/10/18 2226 06/11/18 0427  BP: 110/69 138/86 108/60 124/80  Pulse: (!) 57 66 62 73  Resp: 16 16 16 16   Temp: 98.1 F (36.7 C) 97.9 F (36.6 C) 98.2 F (36.8 C) 98.1 F (36.7 C)  TempSrc: Oral Oral Oral Oral  SpO2: 98% 99% 96% 100%  Weight:      Height:  General: Pt is alert, awake, not in acute distress Cardiovascular: RRR, S1/S2 +, no rubs, no gallops Respiratory: CTA bilaterally, no wheezing, no rhonchi Abdominal: Soft, NT, ND, bowel sounds + Extremities: no edema, no cyanosis    The results of significant diagnostics from this hospitalization (including imaging, microbiology, ancillary and laboratory) are listed below for reference.     Microbiology: No results found for this or any previous visit (from the past 240 hour(s)).   Labs: BNP (last 3 results) No results  for input(s): BNP in the last 8760 hours. Basic Metabolic Panel: Recent Labs  Lab 06/09/18 1133 06/09/18 1141 06/10/18 0411  NA 143 144 141  K 3.4* 3.4* 3.2*  CL 110 110 108  CO2 24  --  27  GLUCOSE 103* 101* 88  BUN 9 6* 12  CREATININE 0.81 0.80 0.74  CALCIUM 8.6*  --  8.8*   Liver Function Tests: Recent Labs  Lab 06/09/18 1133 06/10/18 0411 06/11/18 1012  AST 158* 80* 43*  ALT 194* 137* 98*  ALKPHOS 88 78 84  BILITOT 1.1 1.0 1.0  PROT 7.2 6.7 7.2  ALBUMIN 3.4* 3.4* 3.6   No results for input(s): LIPASE, AMYLASE in the last 168 hours. No results for input(s): AMMONIA in the last 168 hours. CBC: Recent Labs  Lab 06/09/18 1133 06/09/18 1141 06/10/18 0411  WBC 6.1  --  5.8  NEUTROABS 3.1  --   --   HGB 13.7 14.3 12.1  HCT 42.9 42.0 38.1  MCV 89.0  --  88.8  PLT 217  --  200   Cardiac Enzymes: No results for input(s): CKTOTAL, CKMB, CKMBINDEX, TROPONINI in the last 168 hours. BNP: Invalid input(s): POCBNP CBG: Recent Labs  Lab 06/09/18 1124  GLUCAP 85   D-Dimer No results for input(s): DDIMER in the last 72 hours. Hgb A1c Recent Labs    06/10/18 1149  HGBA1C 5.9*   Lipid Profile Recent Labs    06/10/18 1149  CHOL 173  HDL 26*  LDLCALC 117*  TRIG 151*  CHOLHDL 6.7    SIGNED:  Jacquelin Hawking, MD [facility] 06/11/2018, 12:20 PM

## 2018-06-11 NOTE — Progress Notes (Signed)
06/11/18  1436  Copy of stroke booklet and work note given to patient.

## 2018-06-11 NOTE — Progress Notes (Signed)
06/11/18  1349  Reviewed discharge instructions with patient. Patient verbalized understanding of discharge instructions. Copy of discharge instructions given to patient.

## 2018-06-11 NOTE — Discharge Instructions (Signed)
Sheila Walters,  You were admitted because of a concern for stroke. It appears you had a TIA (Transient Ischemic Attack). Unfortunately, it is unsure why this occurred but there have been medication adjustments made to your care. Please follow-up with your primary care physician and the neurologist as an outpatient. You need to start cholesterol lowering medication, however, we will have to wait until your liver tests are normal. I will recommend that you start Crestor 20 mg daily.

## 2018-06-11 NOTE — Plan of Care (Signed)
?  Problem: Elimination: ?Goal: Will not experience complications related to bowel motility ?Outcome: Progressing ?  ?Problem: Pain Managment: ?Goal: General experience of comfort will improve ?Outcome: Progressing ?  ?Problem: Safety: ?Goal: Ability to remain free from injury will improve ?Outcome: Progressing ?  ?

## 2018-06-11 NOTE — Progress Notes (Signed)
Chart review note Was asked to follow up on echo by outgoing neurohospitalist. Echo resulted with no obvious embolic source. Recs as in consult by Dr. Amada JupiterKirkpatrick. Please call neurology with questions  -- Milon DikesAshish Shiniqua Groseclose, MD Neurology

## 2018-08-01 ENCOUNTER — Ambulatory Visit: Payer: 59 | Admitting: Cardiology

## 2018-08-06 ENCOUNTER — Ambulatory Visit: Payer: 59 | Admitting: Neurology

## 2018-08-06 ENCOUNTER — Telehealth: Payer: Self-pay

## 2018-08-06 NOTE — Telephone Encounter (Signed)
Patient was a no call/no show for their appointment today.   

## 2018-08-06 NOTE — Telephone Encounter (Signed)
Patient no show for appointment today.

## 2018-08-07 ENCOUNTER — Encounter: Payer: Self-pay | Admitting: Neurology

## 2019-05-06 ENCOUNTER — Other Ambulatory Visit: Payer: Self-pay | Admitting: Family Medicine

## 2019-05-06 DIAGNOSIS — Z1231 Encounter for screening mammogram for malignant neoplasm of breast: Secondary | ICD-10-CM

## 2020-03-10 IMAGING — CT CT ANGIO NECK
2 of 9 series · 7 of 33 positions shown · IV contrast (ISOVUE)
Comparison: None.

CLINICAL DATA: Right-sided weakness.

EXAM:
CT ANGIOGRAPHY HEAD AND NECK
TECHNIQUE: Multidetector CT imaging of the head and neck was performed using
the standard protocol during bolus administration of intravenous
contrast. Multiplanar CT image reconstructions and MIPs were
obtained to evaluate the vascular anatomy. Carotid stenosis
measurements (when applicable) are obtained utilizing NASCET
criteria, using the distal internal carotid diameter as the
denominator.
CONTRAST:  100mL XWYRZG-WU5 IOPAMIDOL (XWYRZG-WU5) INJECTION 76%

[Series 6: cta head neck thins · axial · 0.39mm/px · z∈[-286,-45]mm · 5 of 723 slices shown]
[im 121/723  soft-tissue]
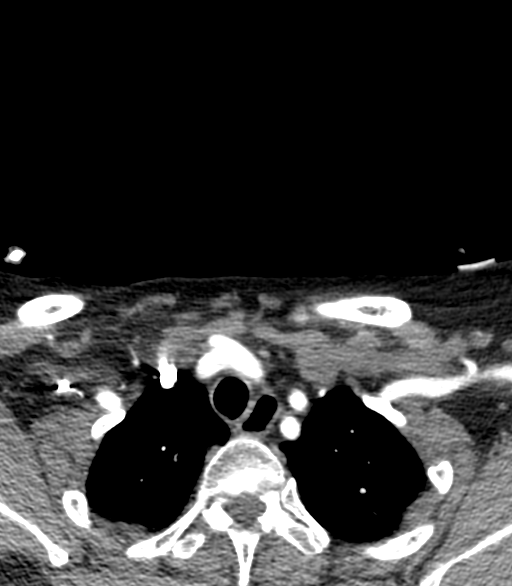
[im 241/723  bone]
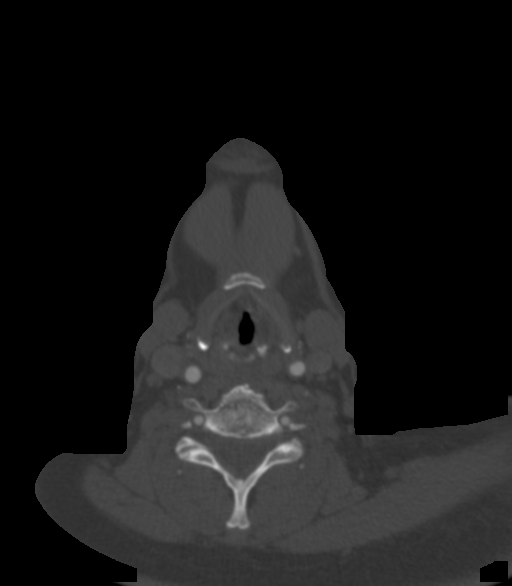
[im 362/723  soft-tissue]
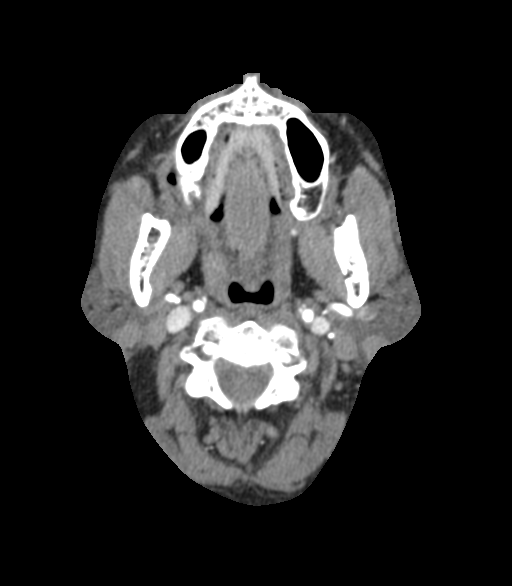
[im 482/723  bone]
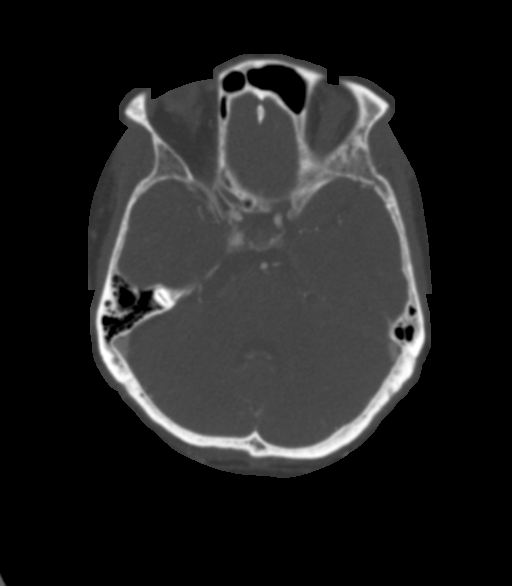
[im 602/723  soft-tissue]
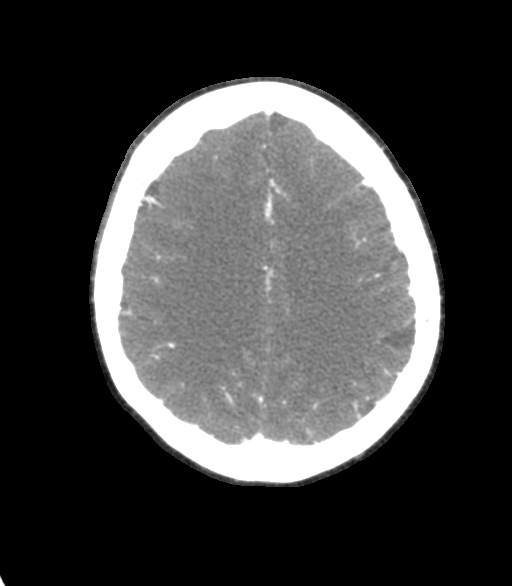

[Series 7: ax thin · axial · 0.39mm/px · z∈[-226,-106]mm · 2 of 362 slices shown]
[im 121/362  soft-tissue]
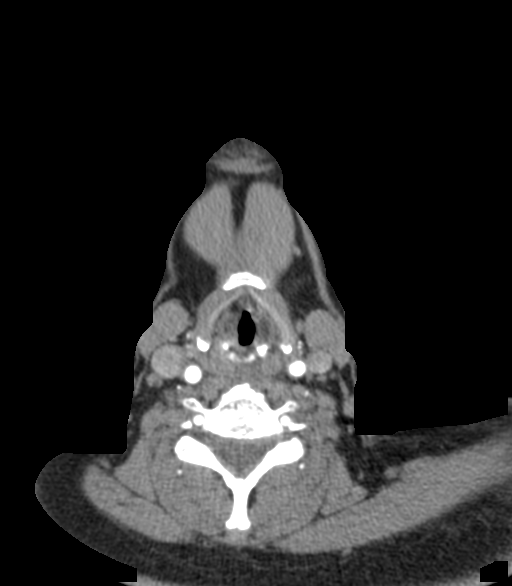
[im 241/362  soft-tissue]
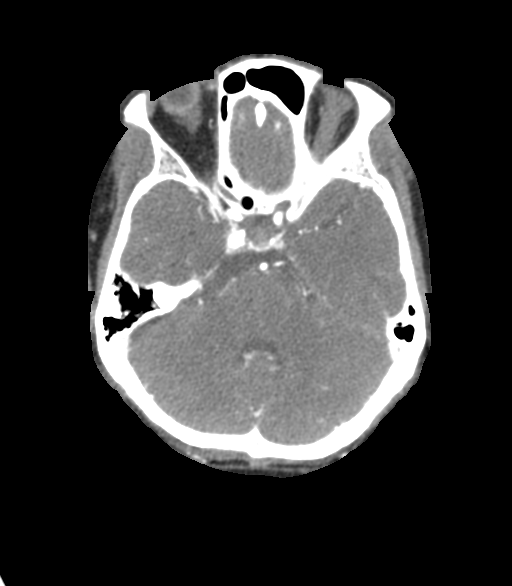

[7 of 33 positions shown; findings below may reference images not displayed]

FINDINGS: CTA NECK FINDINGS

Aortic arch: Normal variant aortic arch branching pattern with
common origin of the brachiocephalic and left common carotid
arteries. Mild arch atherosclerosis. Widely patent arch vessel
origins.

Right carotid system: Patent with mild soft plaque at the carotid
bifurcation not resulting in significant stenosis.

Left carotid system: Patent with mild soft plaque at the carotid
bifurcation not resulting in significant stenosis. Retropharyngeal
course of the proximal ICA.

Vertebral arteries: Patent without evidence of stenosis or
dissection. Slightly dominant right vertebral artery.

Skeleton: Cervical spondylosis with bulky anterior vertebral
spurring at C4-5.

Other neck: Mild diffuse thyroid heterogeneity with the largest
discrete nodule measuring 1.2 cm anteriorly in the left lobe.

Upper chest: Clear lung apices.

Review of the MIP images confirms the above findings

CTA HEAD FINDINGS

Anterior circulation: The internal carotid arteries are patent from
skull base to carotid termini with mild atherosclerotic irregularity
but no significant stenosis. ACAs and MCAs are patent without
evidence of proximal branch occlusion or significant proximal
stenosis. No aneurysm is identified.

Posterior circulation: The intracranial vertebral arteries are
widely patent to the basilar. Patent PICA and SCA origins are
identified bilaterally. The basilar artery is widely patent.
Posterior communicating arteries are diminutive or absent. PCAs are
patent without evidence of significant stenosis. No aneurysm is
identified.

Venous sinuses: Patent.

Anatomic variants: None.

Delayed phase: No abnormal enhancement.

Review of the MIP images confirms the above findings
IMPRESSION: Mild intracranial and cervical atherosclerosis without large vessel
occlusion or significant stenosis.

Aortic Atherosclerosis ([id number]).

These results were called by telephone at the time of interpretation
on 06/09/2018 at [DATE] to Dr. Mish Maraj, who verbally
acknowledged these results.

## 2021-04-29 ENCOUNTER — Other Ambulatory Visit: Payer: Self-pay | Admitting: Family Medicine

## 2021-04-29 DIAGNOSIS — Z1231 Encounter for screening mammogram for malignant neoplasm of breast: Secondary | ICD-10-CM
